# Patient Record
Sex: Male | Born: 1950 | Race: White | Hispanic: No | Smoking: Former smoker
Health system: Southern US, Community
[De-identification: ages and names within clinical notes are randomized; demographics above are authoritative.]

## PROBLEM LIST (undated history)

## (undated) DIAGNOSIS — J439 Emphysema, unspecified: Secondary | ICD-10-CM

## (undated) HISTORY — DX: Emphysema, unspecified: J43.9

---

## 1983-06-28 HISTORY — PX: SKIN GRAFT: SHX250

## 1998-07-27 ENCOUNTER — Emergency Department (HOSPITAL_COMMUNITY): Admission: EM | Admit: 1998-07-27 | Discharge: 1998-07-27 | Payer: Self-pay | Admitting: Emergency Medicine

## 2014-12-10 ENCOUNTER — Other Ambulatory Visit: Payer: Self-pay | Admitting: Hematology & Oncology

## 2018-12-25 ENCOUNTER — Other Ambulatory Visit: Payer: Self-pay

## 2018-12-26 ENCOUNTER — Other Ambulatory Visit: Payer: Self-pay

## 2018-12-26 ENCOUNTER — Ambulatory Visit (INDEPENDENT_AMBULATORY_CARE_PROVIDER_SITE_OTHER): Payer: Medicare HMO

## 2018-12-26 ENCOUNTER — Encounter: Payer: Self-pay | Admitting: Family Medicine

## 2018-12-26 ENCOUNTER — Ambulatory Visit (INDEPENDENT_AMBULATORY_CARE_PROVIDER_SITE_OTHER): Payer: Medicare HMO | Admitting: Family Medicine

## 2018-12-26 VITALS — BP 124/76 | HR 77 | Temp 97.3°F | Ht 64.0 in | Wt 161.0 lb

## 2018-12-26 DIAGNOSIS — Z13220 Encounter for screening for lipoid disorders: Secondary | ICD-10-CM | POA: Diagnosis not present

## 2018-12-26 DIAGNOSIS — B360 Pityriasis versicolor: Secondary | ICD-10-CM

## 2018-12-26 DIAGNOSIS — Z125 Encounter for screening for malignant neoplasm of prostate: Secondary | ICD-10-CM | POA: Diagnosis not present

## 2018-12-26 DIAGNOSIS — Z122 Encounter for screening for malignant neoplasm of respiratory organs: Secondary | ICD-10-CM

## 2018-12-26 DIAGNOSIS — Z1211 Encounter for screening for malignant neoplasm of colon: Secondary | ICD-10-CM

## 2018-12-26 DIAGNOSIS — J439 Emphysema, unspecified: Secondary | ICD-10-CM | POA: Diagnosis not present

## 2018-12-26 DIAGNOSIS — R0602 Shortness of breath: Secondary | ICD-10-CM

## 2018-12-26 DIAGNOSIS — Z136 Encounter for screening for cardiovascular disorders: Secondary | ICD-10-CM | POA: Diagnosis not present

## 2018-12-26 MED ORDER — FLUCONAZOLE 100 MG PO TABS: ORAL_TABLET | ORAL | 0 refills | Status: DC

## 2018-12-26 MED ORDER — BREO ELLIPTA 100-25 MCG/INH IN AEPB: 1.0000 | INHALATION_SPRAY | Freq: Every day | RESPIRATORY_TRACT | 11 refills | Status: DC

## 2018-12-26 MED ORDER — CLOTRIMAZOLE-BETAMETHASONE 1-0.05 % EX CREA: 1.0000 "application " | TOPICAL_CREAM | Freq: Two times a day (BID) | CUTANEOUS | 1 refills | Status: DC

## 2018-12-26 NOTE — Progress Notes (Signed)
Subjective:    Patient ID: Gordon Navarro, male    DOB: Apr 21, 1951, 68 y.o.   MRN: [id number]   HPI: Gordon Navarro is a 68 y.o. male presenting for new patient evaluation.  He has never had a colonoscopy and would like to have that done.  Additionally he is having some shortness of breath.  He was a smoker for many years having stopped about 2 years ago.  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  Additionally he has some dyspnea on exertion for walking from here to the parking lot which would probably be about 100 yards.  He is concerned that he might have COPD but he is never been diagnosed patient has had for several months a rash on his abdomen.  He says it has been present many times in the past as well.  Currently it itches a lot so he scratches it.  He has been putting hydrogen peroxide and Listerine on it.   Depression screen PHQ 2/9 12/26/2018  Decreased Interest 0  Down, Depressed, Hopeless 0  PHQ - 2 Score 0     Relevant past medical, surgical, family and social history reviewed and updated as indicated.  Interim medical history since our last visit reviewed. Allergies and medications reviewed and updated.  ROS:  Review of Systems  Constitutional: Negative.   HENT: Negative.   Eyes: Negative for visual disturbance.  Respiratory: Positive for shortness of breath and wheezing. Negative for cough.   Cardiovascular: Negative for chest pain and leg swelling.  Gastrointestinal: Negative for abdominal pain, diarrhea, nausea and vomiting.  Genitourinary: Negative for difficulty urinating.  Musculoskeletal: Negative for arthralgias and myalgias.  Skin: Positive for rash.  Neurological: Negative for headaches.  Psychiatric/Behavioral: Negative for sleep disturbance.     Social History   Tobacco Use  Smoking Status Former Smoker  Smokeless Tobacco Never Used       Objective:    Physical Exam Constitutional:    General: He is not in acute distress.    Appearance: He is well-developed.  HENT:     Head: Normocephalic and atraumatic.     Right Ear: External ear normal.     Left Ear: External ear normal.     Nose: Nose normal.  Eyes:     Conjunctiva/sclera: Conjunctivae normal.     Pupils: Pupils are equal, round, and reactive to light.  Neck:     Musculoskeletal: Normal range of motion and neck supple.  Cardiovascular:     Rate and Rhythm: Normal rate and regular rhythm.     Heart sounds: Normal heart sounds. No murmur.  Pulmonary:     Effort: Pulmonary effort is normal. No respiratory distress.     Breath sounds: Normal breath sounds. No wheezing or rales.  Abdominal:     Palpations: Abdomen is soft.     Tenderness: There is no abdominal tenderness.  Musculoskeletal: Normal range of motion.  Skin:    General: Skin is warm and dry.  Neurological:     Mental Status: He is alert and oriented to person, place, and time.     Deep Tendon Reflexes: Reflexes are normal and symmetric.  Psychiatric:        Behavior: Behavior normal.        Thought Content: Thought content normal.        Judgment: Judgment normal.     Chest x-ray is negative for masses.  Perhaps minimal hyperexpansion  of the lungs but otherwise normal.  Pulmonary function testing performed that showed severe obstruction.       Assessment & Plan:   1. SOB (shortness of breath)   2. Screening for malignant neoplasm of prostate   3. Lipid screening   4. Pulmonary emphysema, unspecified emphysema type ([place])   5. Encounter for screening for malignant neoplasm of respiratory organs   6. Screen for colon cancer   7. Tinea versicolor     Meds ordered this encounter  Medications  . fluconazole (DIFLUCAN) 100 MG tablet    Sig: Take two with first dose. Then starting the next day take one daily until all are taken.    Dispense:  15 tablet    Refill:  0  . clotrimazole-betamethasone (LOTRISONE) cream    Sig: Apply 1  application topically 2 (two) times daily. To affected areas until rash clears    Dispense:  45 g    Refill:  1  . fluticasone furoate-vilanterol (BREO ELLIPTA) 100-25 MCG/INH AEPB    Sig: Inhale 1 puff into the lungs daily.    Dispense:  1 each    Refill:  11    Orders Placed This Encounter  Procedures  . DG Chest 2 View    Standing Status:   Future    Number of Occurrences:   1    Standing Expiration Date:   02/25/2020    Order Specific Question:   Reason for Exam (SYMPTOM  OR DIAGNOSIS REQUIRED)    Answer:   SOB    Order Specific Question:   Preferred imaging location?    Answer:   Internal  . CT CHEST LUNG CANCER SCREENING LOW DOSE WO CONTRAST    Standing Status:   Future    Standing Expiration Date:   02/26/2020    Order Specific Question:   Radiology Contrast Protocol - do NOT remove file path    Answer:   \\[email or web address]  . CBC with Differential/Platelet  . CMP14+EGFR  . Lipid panel  . PSA, total and free  . Ambulatory referral to Gastroenterology    Referral Priority:   Routine    Referral Type:   Consultation    Referral Reason:   Specialty Services Required    Number of Visits Requested:   1  . PR EVAL OF BRONCHOSPASM      Keanthony was seen today for rash on right side of abd.  Diagnoses and all orders for this visit:  SOB (shortness of breath) -     DG Chest 2 View; Future -     CBC with Differential/Platelet -     CMP14+EGFR -     PR EVAL OF BRONCHOSPASM  Screening for malignant neoplasm of prostate -     PSA, total and free  Lipid screening -     Lipid panel  Pulmonary emphysema, unspecified emphysema type ([place])  Encounter for screening for malignant neoplasm of respiratory organs -     CT CHEST LUNG CANCER SCREENING LOW DOSE WO CONTRAST; Future  Screen for colon cancer -     Ambulatory referral to Gastroenterology  Tinea versicolor  Other orders -     fluconazole (DIFLUCAN) 100 MG tablet; Take two with first dose.  Then starting the next day take one daily until all are taken. -     clotrimazole-betamethasone (LOTRISONE) cream; Apply 1 application topically 2 (two) times daily. To affected areas until rash clears -     fluticasone furoate-vilanterol (BREO ELLIPTA) 100-25 MCG/INH  AEPB; Inhale 1 puff into the lungs daily.      Follow up plan: Return in about 6 weeks (around 02/06/2019).  Claretta Fraise, MD [place]

## 2018-12-27 LAB — CBC WITH DIFFERENTIAL/PLATELET
Basophils Absolute: 0 10*3/uL (ref 0.0–0.2)
Basos: 1 %
EOS (ABSOLUTE): 0.5 10*3/uL — ABNORMAL HIGH (ref 0.0–0.4)
Eos: 6 %
Hematocrit: 42.9 % (ref 37.5–51.0)
Hemoglobin: 13.6 g/dL (ref 13.0–17.7)
Immature Grans (Abs): 0 10*3/uL (ref 0.0–0.1)
Immature Granulocytes: 0 %
Lymphocytes Absolute: 2.3 10*3/uL (ref 0.7–3.1)
Lymphs: 28 %
MCH: 28.3 pg (ref 26.6–33.0)
MCHC: 31.7 g/dL (ref 31.5–35.7)
MCV: 89 fL (ref 79–97)
Monocytes Absolute: 0.7 10*3/uL (ref 0.1–0.9)
Monocytes: 9 %
Neutrophils Absolute: 4.6 10*3/uL (ref 1.4–7.0)
Neutrophils: 56 %
Platelets: 176 10*3/uL (ref 150–450)
RBC: 4.81 x10E6/uL (ref 4.14–5.80)
RDW: 14 % (ref 11.6–15.4)
WBC: 8.1 10*3/uL (ref 3.4–10.8)

## 2018-12-27 LAB — LIPID PANEL
Chol/HDL Ratio: 3.8 ratio (ref 0.0–5.0)
Cholesterol, Total: 165 mg/dL (ref 100–199)
HDL: 44 mg/dL (ref >39)
LDL Calculated: 103 mg/dL — ABNORMAL HIGH (ref 0–99)
Triglycerides: 89 mg/dL (ref 0–149)
VLDL Cholesterol Cal: 18 mg/dL (ref 5–40)

## 2018-12-27 LAB — CMP14+EGFR
ALT: 28 IU/L (ref 0–44)
AST: 28 IU/L (ref 0–40)
Albumin/Globulin Ratio: 1.5 (ref 1.2–2.2)
Albumin: 4.2 g/dL (ref 3.8–4.8)
Alkaline Phosphatase: 91 IU/L (ref 39–117)
BUN/Creatinine Ratio: 15 (ref 10–24)
BUN: 15 mg/dL (ref 8–27)
Bilirubin Total: 0.2 mg/dL (ref 0.0–1.2)
CO2: 25 mmol/L (ref 20–29)
Calcium: 8.9 mg/dL (ref 8.6–10.2)
Chloride: 106 mmol/L (ref 96–106)
Creatinine, Ser: 1 mg/dL (ref 0.76–1.27)
GFR calc Af Amer: 91 mL/min/{1.73_m2} (ref >59)
GFR calc non Af Amer: 79 mL/min/{1.73_m2} (ref >59)
Globulin, Total: 2.8 g/dL (ref 1.5–4.5)
Glucose: 80 mg/dL (ref 65–99)
Potassium: 4.4 mmol/L (ref 3.5–5.2)
Sodium: 145 mmol/L — ABNORMAL HIGH (ref 134–144)
Total Protein: 7 g/dL (ref 6.0–8.5)

## 2018-12-27 LAB — PSA, TOTAL AND FREE
PSA, Free Pct: 50.9 %
PSA, Free: 0.56 ng/mL
Prostate Specific Ag, Serum: 1.1 ng/mL (ref 0.0–4.0)

## 2018-12-28 NOTE — Progress Notes (Signed)
Hello Delson,  Your lab result is normal.Some minor variations that are not significant are commonly marked abnormal, but do not represent any medical problem for you.  Best regards, Claretta Fraise, M.D.

## 2018-12-31 ENCOUNTER — Encounter: Payer: Self-pay | Admitting: Gastroenterology

## 2019-01-21 ENCOUNTER — Other Ambulatory Visit: Payer: Self-pay

## 2019-01-21 ENCOUNTER — Ambulatory Visit (HOSPITAL_COMMUNITY)
Admission: RE | Admit: 2019-01-21 | Discharge: 2019-01-21 | Disposition: A | Discharge status: Home / Self Care | Payer: Medicare HMO | Source: Ambulatory Visit | Attending: Family Medicine | Admitting: Family Medicine

## 2019-01-21 DIAGNOSIS — Z122 Encounter for screening for malignant neoplasm of respiratory organs: Secondary | ICD-10-CM | POA: Diagnosis not present

## 2019-01-21 DIAGNOSIS — F17210 Nicotine dependence, cigarettes, uncomplicated: Secondary | ICD-10-CM | POA: Diagnosis not present

## 2019-01-21 DIAGNOSIS — J439 Emphysema, unspecified: Secondary | ICD-10-CM | POA: Insufficient documentation

## 2019-01-21 DIAGNOSIS — I2510 Atherosclerotic heart disease of native coronary artery without angina pectoris: Secondary | ICD-10-CM | POA: Insufficient documentation

## 2019-01-21 DIAGNOSIS — I700 Atherosclerosis of aorta: Secondary | ICD-10-CM | POA: Insufficient documentation

## 2019-01-21 DIAGNOSIS — Z87891 Personal history of nicotine dependence: Secondary | ICD-10-CM | POA: Diagnosis not present

## 2019-02-06 ENCOUNTER — Ambulatory Visit: Payer: Medicare HMO | Admitting: Family Medicine

## 2019-03-05 ENCOUNTER — Ambulatory Visit: Payer: Medicare HMO | Admitting: Family Medicine

## 2019-03-06 ENCOUNTER — Ambulatory Visit (INDEPENDENT_AMBULATORY_CARE_PROVIDER_SITE_OTHER): Payer: Medicare HMO | Admitting: Family Medicine

## 2019-03-06 ENCOUNTER — Encounter: Payer: Self-pay | Admitting: Family Medicine

## 2019-03-06 DIAGNOSIS — J439 Emphysema, unspecified: Secondary | ICD-10-CM | POA: Insufficient documentation

## 2019-03-06 NOTE — Progress Notes (Signed)
    Subjective:    Patient ID: Gurney Balthazor, male    DOB: 12/10/1950, 68 y.o.   MRN: [id number]   HPI: Aysen Shieh is a 68 y.o. male presenting for recheck of COPD and dermatitis. Using breo daily. He forgets sometimes and he can tell within four hours due to increasing dyspnea. Doesn't occur when he uses the medication daily.  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 That doesn't happen anymore.Occasional cough. Noonproductive. CT lung report reviewed. No sign of Ca. Has phone triage in am FOR COLONOSCOPY.   Depression screen PHQ 2/9 12/26/2018  Decreased Interest 0  Down, Depressed, Hopeless 0  PHQ - 2 Score 0     Relevant past medical, surgical, family and social history reviewed and updated as indicated.  Interim medical history since our last visit reviewed. Allergies and medications reviewed and updated.  ROS:  Review of Systems  Constitutional: Negative.  Negative for fever.  HENT: Positive for rhinorrhea (occasional, clear, when he starts walking.).   Eyes: Negative for visual disturbance.  Respiratory: Negative for cough and shortness of breath.   Cardiovascular: Negative for chest pain and leg swelling.  Gastrointestinal: Negative for abdominal pain, diarrhea, nausea and vomiting.  Genitourinary: Negative for difficulty urinating.  Musculoskeletal: Negative for arthralgias and myalgias.  Skin: Negative for rash.  Neurological: Negative for headaches.  Psychiatric/Behavioral: Negative for sleep disturbance.     Social History   Tobacco Use  Smoking Status Former Smoker  Smokeless Tobacco Never Used       Objective:     Wt Readings from Last 3 Encounters:  12/26/18 161 lb (73 kg)     Exam deferred. Pt. Harbor[place] due to COVID 19. Phone visit performed.   Assessment & Plan:   1. Pulmonary emphysema, unspecified emphysema type ([place])     Continue inhaler - Breo.  Confirmed upcoming Colonoscopy. Reviewed Low dose CT chest - no sign of lung  Ca.    Diagnoses and all orders for this visit:  Pulmonary emphysema, unspecified emphysema type ([place])    Virtual Visit via telephone Note  I discussed the limitations, risks, security and privacy concerns of performing an evaluation and management service by telephone and the availability of in person appointments. The patient was identified with two identifiers. Pt.expressed understanding and agreed to proceed. Pt. Is at home. Dr. Livia Snellen is in his office.  Follow Up Instructions:   I discussed the assessment and treatment plan with the patient. The patient was provided an opportunity to ask questions and all were answered. The patient agreed with the plan and demonstrated an understanding of the instructions.   The patient was advised to call back or seek an in-person evaluation if the symptoms worsen or if the condition fails to improve as anticipated.   Total minutes including chart review and phone contact time: 22   Follow up plan: Return in about 6 months (around 09/03/2019) for CPE.  Claretta Fraise, MD [place]

## 2019-03-07 ENCOUNTER — Ambulatory Visit (INDEPENDENT_AMBULATORY_CARE_PROVIDER_SITE_OTHER): Payer: Self-pay | Admitting: *Deleted

## 2019-03-07 ENCOUNTER — Other Ambulatory Visit: Payer: Self-pay

## 2019-03-07 DIAGNOSIS — Z1211 Encounter for screening for malignant neoplasm of colon: Secondary | ICD-10-CM

## 2019-03-07 MED ORDER — PEG 3350-KCL-NA BICARB-NACL 420 G PO SOLR: 4000.0000 mL | Freq: Once | ORAL | 0 refills | Status: AC

## 2019-03-07 NOTE — Progress Notes (Signed)
Gastroenterology Pre-Procedure Review  Request Date: 03/07/2019 Requesting Physician: Dr. Livia Snellen @ [facility], no previous TCS  PATIENT REVIEW QUESTIONS: The patient responded to the following health history questions as indicated:    1. Diabetes Melitis: No 2. Joint replacements in the past 12 months: No 3. Major health problems in the past 3 months: No 4. Has an artificial valve or MVP: No 5. Has a defibrillator: No 6. Has been advised in past to take antibiotics in advance of a procedure like teeth cleaning: No 7. Family history of colon cancer: No  8. Alcohol Use: Yes, 1 ounce of liquor a day 9. History of sleep apnea: No   10. History of coronary artery or other vascular stents placed within the last 12 months: No 11. History of any prior anesthesia complications: No    MEDICATIONS & ALLERGIES:    Patient reports the following regarding taking any blood thinners:   Plavix? No Aspirin? No Coumadin? No Brilinta? No Xarelto? No Eliquis? No Pradaxa? No Savaysa? No Effient?  No  Patient confirms/reports the following medications:  Current Outpatient Medications  Medication Sig Dispense Refill  . fluticasone furoate-vilanterol (BREO ELLIPTA) 100-25 MCG/INH AEPB Inhale 1 puff into the lungs daily. 1 each 11   No current facility-administered medications for this visit.     Patient confirms/reports the following allergies:  No Known Allergies  No orders of the defined types were placed in this encounter.   [address] INFORMATION Primary Insurance: [facility],  ID#: [id number],  Group #: [id number] Pre-Cert / Auth required: No, not required  Secondary Insurance: [employer],  ID #: [id number] Pre-Cert / Josem Kaufmann required: No, not required  SCHEDULE INFORMATION: Procedure has been scheduled as follows:  Date: 05/15/2019, Time: 8:30 Location: [facility] with Dr. Gala Romney  This Gastroenterology Pre-Precedure Review Form is being routed to the following provider(s): Roseanne Kaufman, NP

## 2019-03-07 NOTE — Patient Instructions (Signed)
Gordon Navarro   1950-07-24 MRN: [id number]    Procedure Date: 05/15/2019 Time to register: 7:30 am Place to register: [facility] Procedure Time: 8:30 am Scheduled provider: Dr. Gala Romney  PREPARATION FOR COLONOSCOPY WITH TRI-LYTE SPLIT PREP  Please notify us immediately if you are diabetic, take iron supplements, or if you are on Coumadin or any other blood thinners.    You will need to purchase 1 fleet enema and 1 box of Bisacodyl 22m tablets.   2 DAYS BEFORE PROCEDURE:  DATE: 05/13/2019   DAY: Monday Begin clear liquid diet AFTER your lunch meal. NO SOLID FOODS after this point.  1 DAY BEFORE PROCEDURE:  DATE: 05/14/2019  DAY: Tuesday Continue clear liquids the entire day - NO SOLID FOOD.   At 2:00 pm:  Take 2 Bisacodyl tablets.   At 4:00pm:  Start drinking your solution. Make sure you mix well per instructions on the bottle. Try to drink 1 (one) 8 ounce glass every 10-15 minutes until you have consumed HALF the jug. You should complete by 6:00pm.You must keep the left over solution refrigerated until completed next day.  Continue clear liquids. You must drink plenty of clear liquids to prevent dehyration and kidney failure.     DAY OF PROCEDURE:   DATE: 05/15/2019  DAY:  Wednesday If you take medications for your heart, blood pressure or breathing, you may take these medications.    Five hours before your procedure time @  3:30 am:  Finish remaining amout of bowel prep, drinking 1 (one) 8 ounce glass every 10-15 minutes until complete. You have two hours to consume remaining prep.   Three hours before your procedure time @ 5:30 am:  Nothing by mouth.   At least one hour before going to the hospital:  Give yourself one Fleet enema. You may take your morning medications with sip of water unless we have instructed otherwise.      Please see below for Dietary Information.  CLEAR LIQUIDS INCLUDE:  Water Jello (NOT red in color)   Ice Popsicles (NOT red in color)    Tea (sugar ok, no milk/cream) Powdered fruit flavored drinks  Coffee (sugar ok, no milk/cream) Gatorade/ Lemonade/ Kool-Aid  (NOT red in color)   Juice: apple, white grape, white cranberry Soft drinks  Clear bullion, consomme, broth (fat free beef/chicken/vegetable)  Carbonated beverages (any kind)  Strained chicken noodle soup Hard Candy   Remember: Clear liquids are liquids that will allow you to see your fingers on the other side of a clear glass. Be sure liquids are NOT red in color, and not cloudy, but CLEAR.  DO NOT EAT OR DRINK ANY OF THE FOLLOWING:  Dairy products of any kind   Cranberry juice Tomato juice / V8 juice   Grapefruit juice Orange juice     Red grape juice  Do not eat any solid foods, including such foods as: cereal, oatmeal, yogurt, fruits, vegetables, creamed soups, eggs, bread, crackers, pureed foods in a blender, etc.   HELPFUL HINTS FOR DRINKING PREP SOLUTION:   Make sure prep is extremely cold. Mix and refrigerate the the morning of the prep. You may also put in the freezer.   You may try mixing some Crystal Light or Country Time Lemonade if you prefer. Mix in small amounts; add more if necessary.  Try drinking through a straw  Rinse mouth with water or a mouthwash between glasses, to remove after-taste.  Try sipping on a cold beverage /ice/ popsicles between glasses  of prep.  Place a piece of sugar-free hard candy in mouth between glasses.  If you become nauseated, try consuming smaller amounts, or stretch out the time between glasses. Stop for 30-60 minutes, then slowly start back drinking.        OTHER INSTRUCTIONS  You will need a responsible adult at least 66 years of age to accompany you and drive you home. This person must remain in the waiting room during your procedure. The hospital will cancel your procedure if you do not have a responsible adult with you.   1. Wear loose fitting clothing that is easily removed. 2. Leave jewelry and  other valuables at home.  3. Remove all body piercing jewelry and leave at home. 4. Total time from sign-in until discharge is approximately 2-3 hours. 5. You should go home directly after your procedure and rest. You can resume normal activities the day after your procedure. 6. The day of your procedure you should not:  Drive  Make legal decisions  Operate machinery  Drink alcohol  Return to work   You may call the office (Dept: [phone]) before 5:00pm, or page the doctor on call [phone]) after 5:00pm, for further instructions, if necessary.   Insurance Information YOU WILL NEED TO CHECK WITH YOUR INSURANCE COMPANY FOR THE BENEFITS OF COVERAGE YOU HAVE FOR THIS PROCEDURE.  UNFORTUNATELY, NOT ALL INSURANCE COMPANIES HAVE BENEFITS TO COVER ALL OR PART OF THESE TYPES OF PROCEDURES.  IT IS YOUR RESPONSIBILITY TO CHECK YOUR BENEFITS, HOWEVER, WE WILL BE GLAD TO ASSIST YOU WITH ANY CODES YOUR INSURANCE COMPANY MAY NEED.    PLEASE NOTE THAT MOST INSURANCE COMPANIES WILL NOT COVER A SCREENING COLONOSCOPY FOR PEOPLE UNDER THE AGE OF 50  IF YOU HAVE BCBS INSURANCE, YOU MAY HAVE BENEFITS FOR A SCREENING COLONOSCOPY BUT IF POLYPS ARE FOUND THE DIAGNOSIS WILL CHANGE AND THEN YOU MAY HAVE A DEDUCTIBLE THAT WILL NEED TO BE MET. SO PLEASE MAKE SURE YOU CHECK YOUR BENEFITS FOR A SCREENING COLONOSCOPY AS WELL AS A DIAGNOSTIC COLONOSCOPY.

## 2019-03-12 NOTE — Progress Notes (Signed)
Needs Propofol.  

## 2019-03-14 ENCOUNTER — Encounter: Payer: Self-pay | Admitting: *Deleted

## 2019-03-14 NOTE — Progress Notes (Signed)
Mailed letter with appointment information and procedure cancellation.   

## 2019-04-02 NOTE — Progress Notes (Signed)
Primary Care Physician:  Claretta Fraise, MD  Primary Gastroenterologist:  Garfield Cornea, MD   Chief Complaint  Patient presents with  . Colonoscopy    consult, never had tcs    HPI:  Gordon Navarro is a 68 y.o. male here at the request of Dr. Livia Snellen for colonoscopy. Due to daily etoh use, he required office visit to discuss sedation. No prior colonoscopy.   Patient denies constipation, diarrhea, melena, rectal bleeding, heartburn, vomiting, dysphagia.  He has some vague right upper quadrant discomfort after he eats, especially if he overeats.  It only occurs when he eats and bends over.  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    Patient drinks a couple of ounces of liquor off and on for years.  Denies heavy or daily use although initially reported to [place] that he drinks 1 ounce of liquor daily.  Current Outpatient Medications  Medication Sig Dispense Refill  . fluticasone furoate-vilanterol (BREO ELLIPTA) 100-25 MCG/INH AEPB Inhale 1 puff into the lungs daily. 1 each 11   No current facility-administered medications for this visit.     Allergies as of 04/03/2019  . (No Known Allergies)    Past Medical History:  Diagnosis Date  . Emphysema of lung [facility])     Past Surgical History:  Procedure Laterality Date  . SKIN GRAFT  1985   Left hand    Family History  Problem Relation Age of Onset  . Heart attack Father   . Colon cancer Neg Hx     Social History   Socioeconomic History  . Marital status: Legally Separated    Spouse name: Not on file  . Number of children: Not on file  . Years of education: Not on file  . Highest education level: Not on file  Occupational History  . Not on file  Social Needs  . Financial resource strain: Not on file  . Food insecurity    Worry: Not on file    Inability: Not on file  . Transportation needs    Medical: Not on file    Non-medical: Not on file  Tobacco Use  . Smoking status:  Former Research scientist (life sciences)  . Smokeless tobacco: Never Used  Substance and Sexual Activity  . Alcohol use: Yes    Frequency: Never    Comment: 1 ounce of liquor some days  . Drug use: Never  . Sexual activity: Not Currently  Lifestyle  . Physical activity    Days per week: Not on file    Minutes per session: Not on file  . Stress: Not on file  Relationships  . Social Herbalist on phone: Not on file    Gets together: Not on file    Attends religious service: Not on file    Active member of club or organization: Not on file    Attends meetings of clubs or organizations: Not on file    Relationship status: Not on file  . Intimate partner violence    Fear of current or ex partner: Not on file    Emotionally abused: Not on file    Physically abused: Not on file    Forced sexual activity: Not on file  Other Topics Concern  . Not on file  Social History Narrative  . Not on file      ROS:  General: Negative for anorexia, weight loss, fever, chills, fatigue, weakness. Eyes: Negative for vision changes.  ENT:  Negative for hoarseness, difficulty swallowing , nasal congestion. CV: Negative for chest pain, angina, palpitations, positive dyspnea on exertion, peripheral edema.  Respiratory: Negative for dyspnea at rest, positive dyspnea on exertion, cough, sputum, wheezing.  GI: See history of present illness. GU:  Negative for dysuria, hematuria, urinary incontinence, urinary frequency, nocturnal urination.  MS: Negative for joint pain, low back pain.  Derm: Negative for rash or itching.  Neuro: Negative for weakness, abnormal sensation, seizure, frequent headaches, memory loss, confusion.  Psych: Negative for anxiety, depression, suicidal ideation, hallucinations.  Endo: Negative for unusual weight change.  Heme: Negative for bruising or bleeding. Allergy: Negative for rash or hives.    Physical Examination:  BP 123/78   Pulse 94   Temp (!) 97.1 F (36.2 C) (Temporal)   Ht  5\' 5"  (1.651 m)   Wt 160 lb 12.8 oz (72.9 kg)   BMI 26.76 kg/m    General: Well-nourished, well-developed in no acute distress.  Head: Normocephalic, atraumatic.   Eyes: Conjunctiva pink, no icterus. Mouth: Oropharyngeal mucosa moist and pink , no lesions erythema or exudate. Neck: Supple without thyromegaly, masses, or lymphadenopathy.  Lungs: Clear to auscultation bilaterally.  Heart: Regular rate and rhythm, no murmurs rubs or gallops.  Abdomen: Bowel sounds are normal, nontender, nondistended, no hepatosplenomegaly or masses, no abdominal bruits or    hernia , no rebound or guarding.   Rectal: deferred Extremities: No lower extremity edema. No clubbing or deformities.  Neuro: Alert and oriented x 4 , grossly normal neurologically.  Skin: Warm and dry, no rash or jaundice.   Psych: Alert and cooperative, normal mood and affect.  Labs: Lab Results  Component Value Date   CREATININE 1.00 12/26/2018   BUN 15 12/26/2018   NA 145 (H) 12/26/2018   K 4.4 12/26/2018   CL 106 12/26/2018   CO2 25 12/26/2018   Lab Results  Component Value Date   ALT 28 12/26/2018   AST 28 12/26/2018   ALKPHOS 91 12/26/2018   BILITOT 0.2 12/26/2018   Lab Results  Component Value Date   WBC 8.1 12/26/2018   HGB 13.6 12/26/2018   HCT 42.9 12/26/2018   MCV 89 12/26/2018   PLT 176 12/26/2018     Imaging Studies: No results found.

## 2019-04-03 ENCOUNTER — Ambulatory Visit (INDEPENDENT_AMBULATORY_CARE_PROVIDER_SITE_OTHER): Payer: Medicare HMO | Admitting: Gastroenterology

## 2019-04-03 ENCOUNTER — Other Ambulatory Visit: Payer: Self-pay

## 2019-04-03 ENCOUNTER — Encounter: Payer: Self-pay | Admitting: Gastroenterology

## 2019-04-03 ENCOUNTER — Other Ambulatory Visit: Payer: Self-pay | Admitting: *Deleted

## 2019-04-03 ENCOUNTER — Encounter: Payer: Self-pay | Admitting: *Deleted

## 2019-04-03 DIAGNOSIS — Z1211 Encounter for screening for malignant neoplasm of colon: Secondary | ICD-10-CM

## 2019-04-03 DIAGNOSIS — J438 Other emphysema: Secondary | ICD-10-CM

## 2019-04-03 DIAGNOSIS — R1011 Right upper quadrant pain: Secondary | ICD-10-CM | POA: Diagnosis not present

## 2019-04-03 DIAGNOSIS — J439 Emphysema, unspecified: Secondary | ICD-10-CM | POA: Insufficient documentation

## 2019-04-03 NOTE — Progress Notes (Signed)
cc'ed to pcp °

## 2019-04-03 NOTE — Patient Instructions (Signed)
1. Colonoscopy as scheduled. See separate instructions.  

## 2019-04-03 NOTE — Assessment & Plan Note (Signed)
68 year old gentleman presenting to schedule first ever screening colonoscopy.  Denies any bowel concerns.  No family history of colon cancer.  Alcohol use as described above, patient denies daily or heavy use although he reported daily use previously to [place].  He also has a history of emphysema.  Plan for deep sedation.  I have discussed the risks, alternatives, benefits with regards to but not limited to the risk of reaction to medication, bleeding, infection, perforation and the patient is agreeable to proceed. Written consent to be obtained.  Complains of 40-year history of vague right upper quadrant discomfort only occurring with bending over after eating.  Especially occurring with overeating.  Given chronicity of symptoms, unlikely significant findings.  Await colonoscopy.  Further recommendations to follow.

## 2019-05-13 ENCOUNTER — Other Ambulatory Visit (HOSPITAL_COMMUNITY): Payer: Medicare HMO

## 2019-06-07 ENCOUNTER — Telehealth: Payer: Self-pay | Admitting: *Deleted

## 2019-06-07 ENCOUNTER — Other Ambulatory Visit (HOSPITAL_COMMUNITY)
Admission: RE | Admit: 2019-06-07 | Discharge: 2019-06-07 | Disposition: A | Discharge status: Home / Self Care | Payer: Medicare HMO | Source: Ambulatory Visit | Attending: Internal Medicine | Admitting: Internal Medicine

## 2019-06-07 ENCOUNTER — Other Ambulatory Visit: Payer: Self-pay

## 2019-06-07 ENCOUNTER — Encounter (HOSPITAL_COMMUNITY)
Admission: RE | Admit: 2019-06-07 | Discharge: 2019-06-07 | Disposition: A | Discharge status: Home / Self Care | Payer: Medicare HMO | Source: Ambulatory Visit | Attending: Internal Medicine | Admitting: Internal Medicine

## 2019-06-07 NOTE — Telephone Encounter (Signed)
-----   Message from Wilmer Floor, RN sent at 06/07/2019  9:15 AM EST ----- Regarding: Patient has a cough, and headache and wants to rescedule Happy Friday,  I called Mr Cutler to tell him his time for procedure for 06/10/2019. He has picked up prep.   He said he wanted to reschedule due to not feeling well.  Headache and cough  Thanks,  Rosalyn Gess

## 2019-06-07 NOTE — Telephone Encounter (Signed)
Called patient. Confirmed he wanted to r/s. He has rescheduled to 3/11 at 10:00am. Patient aware will mail new instructions with new pre-op/covid test. Called endo and made aware of appt change.

## 2019-08-27 ENCOUNTER — Other Ambulatory Visit: Payer: Self-pay

## 2019-08-28 ENCOUNTER — Encounter: Payer: Self-pay | Admitting: Family Medicine

## 2019-08-28 ENCOUNTER — Other Ambulatory Visit: Payer: Self-pay

## 2019-08-28 ENCOUNTER — Ambulatory Visit (INDEPENDENT_AMBULATORY_CARE_PROVIDER_SITE_OTHER): Payer: Medicare HMO | Admitting: Family Medicine

## 2019-08-28 VITALS — BP 126/79 | HR 100 | Temp 98.9°F | Ht 65.0 in | Wt 159.4 lb

## 2019-08-28 DIAGNOSIS — E559 Vitamin D deficiency, unspecified: Secondary | ICD-10-CM | POA: Diagnosis not present

## 2019-08-28 DIAGNOSIS — Z136 Encounter for screening for cardiovascular disorders: Secondary | ICD-10-CM | POA: Diagnosis not present

## 2019-08-28 DIAGNOSIS — Z1159 Encounter for screening for other viral diseases: Secondary | ICD-10-CM | POA: Diagnosis not present

## 2019-08-28 DIAGNOSIS — Z23 Encounter for immunization: Secondary | ICD-10-CM

## 2019-08-28 DIAGNOSIS — Z0000 Encounter for general adult medical examination without abnormal findings: Secondary | ICD-10-CM

## 2019-08-28 DIAGNOSIS — Z0001 Encounter for general adult medical examination with abnormal findings: Secondary | ICD-10-CM

## 2019-08-28 DIAGNOSIS — Z13220 Encounter for screening for lipoid disorders: Secondary | ICD-10-CM

## 2019-08-28 DIAGNOSIS — Z125 Encounter for screening for malignant neoplasm of prostate: Secondary | ICD-10-CM | POA: Diagnosis not present

## 2019-08-28 DIAGNOSIS — J439 Emphysema, unspecified: Secondary | ICD-10-CM

## 2019-08-28 LAB — URINALYSIS
Bilirubin, UA: NEGATIVE
Glucose, UA: NEGATIVE
Ketones, UA: NEGATIVE
Leukocytes,UA: NEGATIVE
Nitrite, UA: NEGATIVE
Protein,UA: NEGATIVE
RBC, UA: NEGATIVE
Specific Gravity, UA: >1.03 — ABNORMAL HIGH (ref 1.005–1.030)
Urobilinogen, Ur: 0.2 mg/dL (ref 0.2–1.0)
pH, UA: 5 (ref 5.0–7.5)

## 2019-08-28 MED ORDER — BREO ELLIPTA 100-25 MCG/INH IN AEPB: 1.0000 | INHALATION_SPRAY | Freq: Every day | RESPIRATORY_TRACT | 11 refills | Status: DC

## 2019-08-28 NOTE — Progress Notes (Signed)
Subjective:  Patient ID: Gordon Navarro, male    DOB: 14-Jul-1950  Age: 69 y.o. MRN: [id number]  CC: Annual Exam and Cyst (Right Hand-6 months)   HPI Gordon Navarro presents for CPE. He is taking Breo daily with good result for controlling COPD. He denies dyspnea.  Depression screen [facility] 2/9 08/28/2019 12/26/2018  Decreased Interest 0 0  Down, Depressed, Hopeless 0 0  PHQ - 2 Score 0 0    History Gordon Navarro has a past medical history of Emphysema of lung (Gordon Navarro).   He has a past surgical history that includes Skin graft (1985).   His family history includes Heart attack in his father.He reports that he has quit smoking. He has never used smokeless tobacco. He reports current alcohol use. He reports that he does not use drugs.    ROS Review of Systems  Constitutional: Negative for activity change, fatigue and unexpected weight change.  HENT: Negative for congestion, ear pain, hearing loss, postnasal drip and trouble swallowing.   Eyes: Negative for pain and visual disturbance.  Respiratory: Negative for cough, chest tightness and shortness of breath.   Cardiovascular: Negative for chest pain, palpitations and leg swelling.  Gastrointestinal: Negative for abdominal distention, abdominal pain, blood in stool, constipation, diarrhea, nausea and vomiting.  Endocrine: Negative for cold intolerance, heat intolerance and polydipsia.  Genitourinary: Negative for difficulty urinating, dysuria, flank pain, frequency and urgency.  Musculoskeletal: Negative for arthralgias and joint swelling.  Skin: Negative for color change, rash and wound.  Neurological: Negative for dizziness, syncope, speech difficulty, weakness, light-headedness, numbness and headaches.  Hematological: Does not bruise/bleed easily.  Psychiatric/Behavioral: Negative for confusion, decreased concentration, dysphoric mood and sleep disturbance. The patient is not nervous/anxious.     Objective:  BP 126/79   Pulse 100   Temp 98.9  F (37.2 C) (Temporal)   Ht 5' 5"  (1.651 m)   Wt 159 lb 6.4 oz (72.3 kg)   BMI 26.53 kg/m   BP Readings from Last 3 Encounters:  08/28/19 126/79  04/03/19 123/78  12/26/18 124/76    Wt Readings from Last 3 Encounters:  08/28/19 159 lb 6.4 oz (72.3 kg)  04/03/19 160 lb 12.8 oz (72.9 kg)  12/26/18 161 lb (73 kg)     Physical Exam Constitutional:      Appearance: He is well-developed.  HENT:     Head: Normocephalic and atraumatic.  Eyes:     Pupils: Pupils are equal, round, and reactive to light.  Neck:     Thyroid: No thyromegaly.     Trachea: No tracheal deviation.  Cardiovascular:     Rate and Rhythm: Normal rate and regular rhythm.     Heart sounds: Normal heart sounds. No murmur. No friction rub. No gallop.   Pulmonary:     Breath sounds: Normal breath sounds. No wheezing or rales.  Abdominal:     General: Bowel sounds are normal. There is no distension.     Palpations: Abdomen is soft. There is no mass.     Tenderness: There is no abdominal tenderness.     Hernia: There is no hernia in the left inguinal area.  Genitourinary:    Penis: Normal.      Testes: Normal.  Musculoskeletal:        General: Normal range of motion.     Cervical back: Normal range of motion.  Lymphadenopathy:     Cervical: No cervical adenopathy.  Skin:    General: Skin is warm and dry.  Neurological:  Mental Status: He is alert and oriented to person, place, and time.       Assessment & Plan:   Gordon Navarro was seen today for annual exam and cyst.  Diagnoses and all orders for this visit:  Pulmonary emphysema, unspecified emphysema type ([place]) -     CBC with Differential/Platelet -     CMP14+EGFR -     Urinalysis  Well adult exam -     CBC with Differential/Platelet -     CMP14+EGFR -     Lipid panel -     Urinalysis  Vitamin D deficiency -     CBC with Differential/Platelet -     CMP14+EGFR -     VITAMIN D 25 Hydroxy (Vit-D Deficiency, Fractures)  Need for hepatitis C  screening test -     CBC with Differential/Platelet -     CMP14+EGFR  Screening for prostate cancer -     CBC with Differential/Platelet -     CMP14+EGFR -     PSA Total (Reflex To Free)  Screening cholesterol level -     Lipid panel  Other orders -     fluticasone furoate-vilanterol (BREO ELLIPTA) 100-25 MCG/INH AEPB; Inhale 1 puff into the lungs daily. -     Pneumococcal conjugate vaccine 13-valent       I have discontinued Gordon Navarro's polyethylene glycol-electrolytes. I am also having him maintain his ibuprofen and Breo Ellipta.  Allergies as of 08/28/2019   No Known Allergies     Medication List       Accurate as of August 28, 2019 10:31 PM. If you have any questions, ask your nurse or doctor.        STOP taking these medications   polyethylene glycol-electrolytes 420 g solution Commonly known as: NuLYTELY Stopped by: Claretta Fraise, MD     TAKE these medications   Breo Ellipta 100-25 MCG/INH Aepb Generic drug: fluticasone furoate-vilanterol Inhale 1 puff into the lungs daily.   ibuprofen 200 MG tablet Commonly known as: ADVIL Take 200-400 mg by mouth every 8 (eight) hours as needed (headache).        Follow-up: Return in about 1 year (around 08/27/2020).  Claretta Fraise, M.D.

## 2019-08-29 ENCOUNTER — Other Ambulatory Visit: Payer: Self-pay

## 2019-08-29 LAB — CMP14+EGFR
ALT: 38 IU/L (ref 0–44)
AST: 30 IU/L (ref 0–40)
Albumin/Globulin Ratio: 1.3 (ref 1.2–2.2)
Albumin: 4.5 g/dL (ref 3.8–4.8)
Alkaline Phosphatase: 123 IU/L — ABNORMAL HIGH (ref 39–117)
BUN/Creatinine Ratio: 17 (ref 10–24)
BUN: 24 mg/dL (ref 8–27)
Bilirubin Total: 0.3 mg/dL (ref 0.0–1.2)
CO2: 21 mmol/L (ref 20–29)
Calcium: 8.8 mg/dL (ref 8.6–10.2)
Chloride: 100 mmol/L (ref 96–106)
Creatinine, Ser: 1.41 mg/dL — ABNORMAL HIGH (ref 0.76–1.27)
GFR calc Af Amer: 60 mL/min/{1.73_m2} (ref >59)
GFR calc non Af Amer: 52 mL/min/{1.73_m2} — ABNORMAL LOW (ref >59)
Globulin, Total: 3.5 g/dL (ref 1.5–4.5)
Glucose: 92 mg/dL (ref 65–99)
Potassium: 4.5 mmol/L (ref 3.5–5.2)
Sodium: 138 mmol/L (ref 134–144)
Total Protein: 8 g/dL (ref 6.0–8.5)

## 2019-08-29 LAB — CBC WITH DIFFERENTIAL/PLATELET
Basophils Absolute: 0 10*3/uL (ref 0.0–0.2)
Basos: 0 %
EOS (ABSOLUTE): 0.2 10*3/uL (ref 0.0–0.4)
Eos: 2 %
Hematocrit: 47.6 % (ref 37.5–51.0)
Hemoglobin: 15.6 g/dL (ref 13.0–17.7)
Immature Grans (Abs): 0 10*3/uL (ref 0.0–0.1)
Immature Granulocytes: 0 %
Lymphocytes Absolute: 2.2 10*3/uL (ref 0.7–3.1)
Lymphs: 18 %
MCH: 28.8 pg (ref 26.6–33.0)
MCHC: 32.8 g/dL (ref 31.5–35.7)
MCV: 88 fL (ref 79–97)
Monocytes Absolute: 0.9 10*3/uL (ref 0.1–0.9)
Monocytes: 8 %
Neutrophils Absolute: 8.3 10*3/uL — ABNORMAL HIGH (ref 1.4–7.0)
Neutrophils: 72 %
Platelets: 193 10*3/uL (ref 150–450)
RBC: 5.41 x10E6/uL (ref 4.14–5.80)
RDW: 13.7 % (ref 11.6–15.4)
WBC: 11.7 10*3/uL — ABNORMAL HIGH (ref 3.4–10.8)

## 2019-08-29 LAB — PSA TOTAL (REFLEX TO FREE): Prostate Specific Ag, Serum: 1.5 ng/mL (ref 0.0–4.0)

## 2019-08-29 LAB — LIPID PANEL
Chol/HDL Ratio: 3.5 ratio (ref 0.0–5.0)
Cholesterol, Total: 187 mg/dL (ref 100–199)
HDL: 53 mg/dL (ref >39)
LDL Chol Calc (NIH): 121 mg/dL — ABNORMAL HIGH (ref 0–99)
Triglycerides: 70 mg/dL (ref 0–149)
VLDL Cholesterol Cal: 13 mg/dL (ref 5–40)

## 2019-08-29 LAB — VITAMIN D 25 HYDROXY (VIT D DEFICIENCY, FRACTURES): Vit D, 25-Hydroxy: 22.4 ng/mL — ABNORMAL LOW (ref 30.0–100.0)

## 2019-08-29 MED ORDER — VITAMIN D (ERGOCALCIFEROL) 1.25 MG (50000 UNIT) PO CAPS: 50000.0000 [IU] | ORAL_CAPSULE | ORAL | 1 refills | Status: DC

## 2019-09-03 ENCOUNTER — Other Ambulatory Visit: Payer: Self-pay

## 2019-09-03 ENCOUNTER — Telehealth: Payer: Self-pay

## 2019-09-03 ENCOUNTER — Encounter (HOSPITAL_COMMUNITY)
Admission: RE | Admit: 2019-09-03 | Discharge: 2019-09-03 | Disposition: A | Discharge status: Home / Self Care | Payer: Medicare HMO | Source: Ambulatory Visit | Attending: Internal Medicine | Admitting: Internal Medicine

## 2019-09-03 ENCOUNTER — Other Ambulatory Visit (HOSPITAL_COMMUNITY)
Admission: RE | Admit: 2019-09-03 | Discharge: 2019-09-03 | Disposition: A | Discharge status: Home / Self Care | Payer: Medicare HMO | Source: Ambulatory Visit | Attending: Internal Medicine | Admitting: Internal Medicine

## 2019-09-03 NOTE — Telephone Encounter (Signed)
Received message from pre-op nurse, unable to contact pt for pre-op phone call.  Tried to call pt, no answer, LMOVM.

## 2019-09-04 ENCOUNTER — Encounter: Payer: Medicare HMO | Admitting: Family Medicine

## 2019-09-04 NOTE — Telephone Encounter (Signed)
Spoke with carolyn in endo. If patient called back he will have to r/s. Orders placed in depot

## 2019-09-04 NOTE — Telephone Encounter (Signed)
Tried to call pt, no answer, LMOVM for return call.  

## 2019-09-05 ENCOUNTER — Ambulatory Visit (HOSPITAL_COMMUNITY): Admission: RE | Admit: 2019-09-05 | Payer: Medicare HMO | Source: Home / Self Care | Admitting: Internal Medicine

## 2019-09-05 ENCOUNTER — Encounter (HOSPITAL_COMMUNITY): Admission: RE | Payer: Self-pay | Source: Home / Self Care

## 2019-09-05 SURGERY — COLONOSCOPY WITH PROPOFOL
Anesthesia: Monitor Anesthesia Care

## 2019-09-05 NOTE — Telephone Encounter (Signed)
Patient never called back. Procedure cancelled. FYI to LSL

## 2019-09-05 NOTE — Telephone Encounter (Signed)
noted 

## 2019-10-15 ENCOUNTER — Encounter (HOSPITAL_COMMUNITY): Payer: Self-pay | Admitting: Emergency Medicine

## 2019-10-15 ENCOUNTER — Emergency Department (HOSPITAL_COMMUNITY): Payer: Medicare HMO

## 2019-10-15 ENCOUNTER — Other Ambulatory Visit: Payer: Self-pay

## 2019-10-15 ENCOUNTER — Observation Stay (HOSPITAL_COMMUNITY)
Admission: EM | Admit: 2019-10-15 | Discharge: 2019-10-19 | Disposition: A | Discharge status: Home / Self Care | Payer: Medicare HMO | Attending: Internal Medicine | Admitting: Internal Medicine

## 2019-10-15 DIAGNOSIS — J441 Chronic obstructive pulmonary disease with (acute) exacerbation: Secondary | ICD-10-CM

## 2019-10-15 DIAGNOSIS — J439 Emphysema, unspecified: Secondary | ICD-10-CM | POA: Diagnosis not present

## 2019-10-15 DIAGNOSIS — Z87891 Personal history of nicotine dependence: Secondary | ICD-10-CM | POA: Diagnosis not present

## 2019-10-15 DIAGNOSIS — K8012 Calculus of gallbladder with acute and chronic cholecystitis without obstruction: Principal | ICD-10-CM | POA: Insufficient documentation

## 2019-10-15 DIAGNOSIS — Z20822 Contact with and (suspected) exposure to covid-19: Secondary | ICD-10-CM | POA: Insufficient documentation

## 2019-10-15 DIAGNOSIS — R05 Cough: Secondary | ICD-10-CM | POA: Diagnosis not present

## 2019-10-15 DIAGNOSIS — K821 Hydrops of gallbladder: Secondary | ICD-10-CM | POA: Insufficient documentation

## 2019-10-15 DIAGNOSIS — K810 Acute cholecystitis: Secondary | ICD-10-CM | POA: Diagnosis not present

## 2019-10-15 DIAGNOSIS — Z03818 Encounter for observation for suspected exposure to other biological agents ruled out: Secondary | ICD-10-CM | POA: Diagnosis not present

## 2019-10-15 DIAGNOSIS — R1011 Right upper quadrant pain: Secondary | ICD-10-CM

## 2019-10-15 DIAGNOSIS — K828 Other specified diseases of gallbladder: Secondary | ICD-10-CM | POA: Diagnosis not present

## 2019-10-15 DIAGNOSIS — Z7951 Long term (current) use of inhaled steroids: Secondary | ICD-10-CM | POA: Diagnosis not present

## 2019-10-15 DIAGNOSIS — R1084 Generalized abdominal pain: Secondary | ICD-10-CM

## 2019-10-15 DIAGNOSIS — R109 Unspecified abdominal pain: Secondary | ICD-10-CM

## 2019-10-15 LAB — URINALYSIS, ROUTINE W REFLEX MICROSCOPIC
Bacteria, UA: NONE SEEN
Bilirubin Urine: NEGATIVE
Glucose, UA: NEGATIVE mg/dL
Hgb urine dipstick: NEGATIVE
Ketones, ur: 5 mg/dL — AB
Leukocytes,Ua: NEGATIVE
Nitrite: NEGATIVE
Protein, ur: 30 mg/dL — AB
Specific Gravity, Urine: 1.021 (ref 1.005–1.030)
pH: 6 (ref 5.0–8.0)

## 2019-10-15 LAB — BASIC METABOLIC PANEL
Anion gap: 11 (ref 5–15)
BUN: 10 mg/dL (ref 8–23)
CO2: 27 mmol/L (ref 22–32)
Calcium: 8.8 mg/dL — ABNORMAL LOW (ref 8.9–10.3)
Chloride: 97 mmol/L — ABNORMAL LOW (ref 98–111)
Creatinine, Ser: 1.14 mg/dL (ref 0.61–1.24)
GFR calc Af Amer: >60 mL/min (ref >60)
GFR calc non Af Amer: >60 mL/min (ref >60)
Glucose, Bld: 132 mg/dL — ABNORMAL HIGH (ref 70–99)
Potassium: 4 mmol/L (ref 3.5–5.1)
Sodium: 135 mmol/L (ref 135–145)

## 2019-10-15 LAB — CBC
HCT: 45.4 % (ref 39.0–52.0)
Hemoglobin: 14.2 g/dL (ref 13.0–17.0)
MCH: 28.5 pg (ref 26.0–34.0)
MCHC: 31.3 g/dL (ref 30.0–36.0)
MCV: 91.2 fL (ref 80.0–100.0)
Platelets: 218 10*3/uL (ref 150–400)
RBC: 4.98 MIL/uL (ref 4.22–5.81)
RDW: 14.2 % (ref 11.5–15.5)
WBC: 11.6 10*3/uL — ABNORMAL HIGH (ref 4.0–10.5)
nRBC: 0 % (ref 0.0–0.2)

## 2019-10-15 LAB — LIPASE, BLOOD: Lipase: 17 U/L (ref 11–51)

## 2019-10-15 MED ORDER — IOHEXOL 300 MG/ML  SOLN
100.0000 mL | Freq: Once | INTRAMUSCULAR | Status: AC | PRN
  Administered 2019-10-15: 100 mL via INTRAVENOUS

## 2019-10-15 MED ORDER — ONDANSETRON HCL 4 MG/2ML IJ SOLN
4.0000 mg | Freq: Once | INTRAMUSCULAR | Status: AC
  Administered 2019-10-15: 4 mg via INTRAVENOUS
  Filled 2019-10-15: qty 2

## 2019-10-15 MED ORDER — MORPHINE SULFATE (PF) 4 MG/ML IV SOLN
4.0000 mg | Freq: Once | INTRAVENOUS | Status: AC
  Administered 2019-10-15: 4 mg via INTRAVENOUS
  Filled 2019-10-15: qty 1

## 2019-10-15 NOTE — ED Notes (Signed)
Pt in bed, pt reports that he has been vomiting for the past 4 days.  Pt abd is distended, very tender on the R, pt reports that he has been vomiting every time he tries to eat or drink anything.  Pt has expiratory wheezes, pt on cardiac and O2 sat monitor.  Pt talking in full sentences, iv placed, blood and urine samples obtained, pt awaits md eval

## 2019-10-15 NOTE — ED Triage Notes (Addendum)
Patient states abdominal pain x 4 days. Patient states that he is unable to eat or drink. Patient states that has had vomiting every time that he tries to eat or drink. Patient does have some respiratory congestion and has a hx of COPD.

## 2019-10-16 ENCOUNTER — Encounter (HOSPITAL_COMMUNITY): Payer: Self-pay | Admitting: Internal Medicine

## 2019-10-16 ENCOUNTER — Inpatient Hospital Stay (HOSPITAL_COMMUNITY): Payer: Medicare HMO

## 2019-10-16 DIAGNOSIS — K810 Acute cholecystitis: Principal | ICD-10-CM | POA: Diagnosis present

## 2019-10-16 DIAGNOSIS — K760 Fatty (change of) liver, not elsewhere classified: Secondary | ICD-10-CM | POA: Diagnosis not present

## 2019-10-16 DIAGNOSIS — K8012 Calculus of gallbladder with acute and chronic cholecystitis without obstruction: Secondary | ICD-10-CM | POA: Diagnosis not present

## 2019-10-16 DIAGNOSIS — K828 Other specified diseases of gallbladder: Secondary | ICD-10-CM | POA: Diagnosis not present

## 2019-10-16 DIAGNOSIS — R1011 Right upper quadrant pain: Secondary | ICD-10-CM

## 2019-10-16 DIAGNOSIS — R05 Cough: Secondary | ICD-10-CM | POA: Diagnosis not present

## 2019-10-16 LAB — CBC WITH DIFFERENTIAL/PLATELET
Abs Immature Granulocytes: 0.03 10*3/uL (ref 0.00–0.07)
Basophils Absolute: 0 10*3/uL (ref 0.0–0.1)
Basophils Relative: 0 %
Eosinophils Absolute: 0.1 10*3/uL (ref 0.0–0.5)
Eosinophils Relative: 1 %
HCT: 41.2 % (ref 39.0–52.0)
Hemoglobin: 12.9 g/dL — ABNORMAL LOW (ref 13.0–17.0)
Immature Granulocytes: 0 %
Lymphocytes Relative: 19 %
Lymphs Abs: 1.5 10*3/uL (ref 0.7–4.0)
MCH: 28.4 pg (ref 26.0–34.0)
MCHC: 31.3 g/dL (ref 30.0–36.0)
MCV: 90.5 fL (ref 80.0–100.0)
Monocytes Absolute: 0.8 10*3/uL (ref 0.1–1.0)
Monocytes Relative: 10 %
Neutro Abs: 5.3 10*3/uL (ref 1.7–7.7)
Neutrophils Relative %: 70 %
Platelets: 192 10*3/uL (ref 150–400)
RBC: 4.55 MIL/uL (ref 4.22–5.81)
RDW: 14.3 % (ref 11.5–15.5)
WBC: 7.6 10*3/uL (ref 4.0–10.5)
nRBC: 0 % (ref 0.0–0.2)

## 2019-10-16 LAB — COMPREHENSIVE METABOLIC PANEL
ALT: 219 U/L — ABNORMAL HIGH (ref 0–44)
AST: 322 U/L — ABNORMAL HIGH (ref 15–41)
Albumin: 3.1 g/dL — ABNORMAL LOW (ref 3.5–5.0)
Alkaline Phosphatase: 137 U/L — ABNORMAL HIGH (ref 38–126)
Anion gap: 10 (ref 5–15)
BUN: 14 mg/dL (ref 8–23)
CO2: 27 mmol/L (ref 22–32)
Calcium: 8.2 mg/dL — ABNORMAL LOW (ref 8.9–10.3)
Chloride: 97 mmol/L — ABNORMAL LOW (ref 98–111)
Creatinine, Ser: 1.2 mg/dL (ref 0.61–1.24)
GFR calc Af Amer: >60 mL/min (ref >60)
GFR calc non Af Amer: >60 mL/min (ref >60)
Glucose, Bld: 126 mg/dL — ABNORMAL HIGH (ref 70–99)
Potassium: 4.4 mmol/L (ref 3.5–5.1)
Sodium: 134 mmol/L — ABNORMAL LOW (ref 135–145)
Total Bilirubin: 1.8 mg/dL — ABNORMAL HIGH (ref 0.3–1.2)
Total Protein: 6.5 g/dL (ref 6.5–8.1)

## 2019-10-16 LAB — HEPATIC FUNCTION PANEL
ALT: 39 U/L (ref 0–44)
AST: 25 U/L (ref 15–41)
Albumin: 3.9 g/dL (ref 3.5–5.0)
Alkaline Phosphatase: 103 U/L (ref 38–126)
Bilirubin, Direct: 0.1 mg/dL (ref 0.0–0.2)
Indirect Bilirubin: 0.8 mg/dL (ref 0.3–0.9)
Total Bilirubin: 0.9 mg/dL (ref 0.3–1.2)
Total Protein: 8.2 g/dL — ABNORMAL HIGH (ref 6.5–8.1)

## 2019-10-16 LAB — MAGNESIUM: Magnesium: 1.6 mg/dL — ABNORMAL LOW (ref 1.7–2.4)

## 2019-10-16 LAB — LIPASE, BLOOD: Lipase: 18 U/L (ref 11–51)

## 2019-10-16 LAB — HIV ANTIBODY (ROUTINE TESTING W REFLEX): HIV Screen 4th Generation wRfx: NONREACTIVE

## 2019-10-16 LAB — SARS CORONAVIRUS 2 (TAT 6-24 HRS): SARS Coronavirus 2: NEGATIVE

## 2019-10-16 MED ORDER — MAGNESIUM SULFATE 2 GM/50ML IV SOLN
2.0000 g | Freq: Once | INTRAVENOUS | Status: AC
  Administered 2019-10-16: 05:00:00 2 g via INTRAVENOUS
  Filled 2019-10-16: qty 50

## 2019-10-16 MED ORDER — ONDANSETRON HCL 4 MG/2ML IJ SOLN: 4.0000 mg | Freq: Four times a day (QID) | INTRAMUSCULAR | Status: DC | PRN

## 2019-10-16 MED ORDER — SODIUM CHLORIDE 0.9 % IV SOLN: INTRAVENOUS | Status: DC

## 2019-10-16 MED ORDER — PIPERACILLIN-TAZOBACTAM 3.375 G IVPB
3.3750 g | Freq: Three times a day (TID) | INTRAVENOUS | Status: AC
  Administered 2019-10-16 – 2019-10-18 (×9): 3.375 g via INTRAVENOUS
  Filled 2019-10-16 (×10): qty 50

## 2019-10-16 MED ORDER — IPRATROPIUM-ALBUTEROL 20-100 MCG/ACT IN AERS: 2.0000 | INHALATION_SPRAY | Freq: Four times a day (QID) | RESPIRATORY_TRACT | Status: DC | PRN

## 2019-10-16 MED ORDER — ACETAMINOPHEN 325 MG PO TABS
650.0000 mg | ORAL_TABLET | Freq: Four times a day (QID) | ORAL | Status: DC | PRN
  Administered 2019-10-16: 650 mg via ORAL
  Filled 2019-10-16: qty 2

## 2019-10-16 MED ORDER — PANTOPRAZOLE SODIUM 40 MG IV SOLR
40.0000 mg | Freq: Every day | INTRAVENOUS | Status: DC
  Administered 2019-10-16 – 2019-10-19 (×3): 40 mg via INTRAVENOUS
  Filled 2019-10-16 (×5): qty 40

## 2019-10-16 MED ORDER — BISACODYL 5 MG PO TBEC
5.0000 mg | DELAYED_RELEASE_TABLET | Freq: Once | ORAL | Status: AC
  Administered 2019-10-16: 03:00:00 5 mg via ORAL
  Filled 2019-10-16: qty 1

## 2019-10-16 MED ORDER — BUDESONIDE 0.5 MG/2ML IN SUSP
0.5000 mg | Freq: Two times a day (BID) | RESPIRATORY_TRACT | Status: DC
  Administered 2019-10-16 – 2019-10-19 (×5): 0.5 mg via RESPIRATORY_TRACT
  Filled 2019-10-16 (×6): qty 2

## 2019-10-16 MED ORDER — IPRATROPIUM-ALBUTEROL 0.5-2.5 (3) MG/3ML IN SOLN
3.0000 mL | Freq: Four times a day (QID) | RESPIRATORY_TRACT | Status: DC
  Administered 2019-10-16 – 2019-10-19 (×8): 3 mL via RESPIRATORY_TRACT
  Filled 2019-10-16 (×9): qty 3

## 2019-10-16 MED ORDER — MORPHINE SULFATE (PF) 4 MG/ML IV SOLN
4.0000 mg | INTRAVENOUS | Status: DC | PRN
  Administered 2019-10-16 – 2019-10-17 (×8): 4 mg via INTRAVENOUS
  Filled 2019-10-16 (×8): qty 1

## 2019-10-16 MED ORDER — ONDANSETRON HCL 4 MG PO TABS: 4.0000 mg | ORAL_TABLET | Freq: Four times a day (QID) | ORAL | Status: DC | PRN

## 2019-10-16 MED ORDER — MORPHINE SULFATE (PF) 4 MG/ML IV SOLN: 4.0000 mg | Freq: Once | INTRAVENOUS | Status: DC

## 2019-10-16 MED ORDER — FLUTICASONE FUROATE-VILANTEROL 100-25 MCG/INH IN AEPB: 1.0000 | INHALATION_SPRAY | Freq: Every day | RESPIRATORY_TRACT | Status: DC

## 2019-10-16 MED ORDER — FLUTICASONE FUROATE-VILANTEROL 100-25 MCG/INH IN AEPB
1.0000 | INHALATION_SPRAY | Freq: Every day | RESPIRATORY_TRACT | Status: DC
  Administered 2019-10-16: 1 via RESPIRATORY_TRACT
  Filled 2019-10-16: qty 28

## 2019-10-16 MED ORDER — ACETAMINOPHEN 650 MG RE SUPP: 650.0000 mg | Freq: Four times a day (QID) | RECTAL | Status: DC | PRN

## 2019-10-16 NOTE — Care Management Important Message (Signed)
Important Message  Patient Details  Name: Gordon Navarro MRN: [id number] Date of Birth: 1950/12/24   Medicare Important Message Given:  Yes     Annice Needy, LCSW 10/16/2019, 5:44 PM

## 2019-10-16 NOTE — Progress Notes (Addendum)
PROGRESS NOTE  Gordon Navarro GYI:[id number] DOB: 10/27/1950 DOA: 10/15/2019 PCP: Mechele Claude, MD  Brief History:  69 year old male with a history of COPD and no other documented chronic medical problems presenting with right upper quadrant abdominal pain with associated nausea and vomiting that began on 10/11/2019.  The patient has had subjective fevers and chills.  He denies any hematemesis, diarrhea, hematochezia, melena, dysuria, hematuria.  Because of worsening pain, he presented for further evaluation.  In the emergency department, the patient had a low-grade temperature of 99.7 F.  He was hemodynamically stable without hypoxia.  Lipase was 18 with normal LFTs initially.  CT of the abdomen and pelvis showed gallbladder distention.  Right upper quadrant ultrasound showed gallbladder filled with sludge and distended.  General surgery was consulted to assist with management.  Assessment/Plan: Acute cholecystitis -General surgery consult -Case discussed with general surgery, Dr. Lovell Sheehan -Continue Zosyn -Continue IV fluids -Abdominal pain worsened with clear liquids--> convert back to n.p.o. except for ice chips -Ultimately planning for cholecystectomy -UA without pyuria -10/16/2019 LFTs trending up -Repeat CMP in the morning -Judicious pain control -CT abdomen and right upper quadrant ultrasound as discussed above  COPD -Start Pulmicort -Start duo nebs -Stable on room air  Hypomagnesemia -Replete -Check magnesium again      Disposition Plan: Patient From: Home D/C Place: Home- 2-3  Days Barriers: Not Clinically Stable--needs cholecystectomy  Family Communication:   Family at bedside  Consultants:  General surgery  Code Status:  FULL   DVT Prophylaxis:  [place] Heparin   Procedures: As Listed in Progress Note Above  Antibiotics: Zosyn 4/20>>>  Total time spent 35 minutes.  Greater than 50% spent face to face counseling and coordinating  care.    Subjective: Patient continues to have epigastric and right upper quadrant abdominal pain.  He denies any fevers, chills, chest pain, vomiting.  He has nausea.  He has increasing pain with liquid intake.  He has not had any flatus.  Objective: Vitals:   10/16/19 0406 10/16/19 0811 10/16/19 0817 10/16/19 0900  BP: 120/70     Pulse: 80     Resp: 20     Temp: 99.1 F (37.3 C)     TempSrc: Oral     SpO2: 99% 100% 98% 98%  Weight:      Height:        Intake/Output Summary (Last 24 hours) at 10/16/2019 1630 Last data filed at 10/16/2019 1500 Gross per 24 hour  Intake 446.35 ml  Output --  Net 446.35 ml   Weight change:  Exam:   General:  Pt is alert, follows commands appropriately, not in acute distress  HEENT: No icterus, No thrush, No neck mass, [place]/AT  Cardiovascular: RRR, S1/S2, no rubs, no gallops  Respiratory: Bibasal expiratory wheeze.  Diminished breath sounds bilateral.  Bibasilar rales.  Abdomen: Soft/+BS, non tender, non distended, no guarding  Extremities: No edema, No lymphangitis, No petechiae, No rashes, no synovitis   Data Reviewed: I have personally reviewed following labs and imaging studies Basic Metabolic Panel: Recent Labs  Lab 10/15/19 2258 10/16/19 0818  NA 135 134*  K 4.0 4.4  CL 97* 97*  CO2 27 27  GLUCOSE 132* 126*  BUN 10 14  CREATININE 1.14 1.20  CALCIUM 8.8* 8.2*  MG 1.6*  --    Liver Function Tests: Recent Labs  Lab 10/15/19 2258 10/16/19 0818  AST 25 322*  ALT 39 219*  ALKPHOS 103  137*  BILITOT 0.9 1.8*  PROT 8.2* 6.5  ALBUMIN 3.9 3.1*   Recent Labs  Lab 10/15/19 2258  LIPASE 18  17   No results for input(s): AMMONIA in the last 168 hours. Coagulation Profile: No results for input(s): INR, PROTIME in the last 168 hours. CBC: Recent Labs  Lab 10/15/19 2258 10/16/19 0818  WBC 11.6* 7.6  NEUTROABS  --  5.3  HGB 14.2 12.9*  HCT 45.4 41.2  MCV 91.2 90.5  PLT 218 192   Cardiac Enzymes: No results  for input(s): CKTOTAL, CKMB, CKMBINDEX, TROPONINI in the last 168 hours. BNP: Invalid input(s): POCBNP CBG: No results for input(s): GLUCAP in the last 168 hours. HbA1C: No results for input(s): HGBA1C in the last 72 hours. Urine analysis:    Component Value Date/Time   COLORURINE YELLOW 10/15/2019 2240   APPEARANCEUR CLEAR 10/15/2019 2240   APPEARANCEUR Clear 08/28/2019 1157   LABSPEC 1.021 10/15/2019 2240   PHURINE 6.0 10/15/2019 2240   GLUCOSEU NEGATIVE 10/15/2019 2240   HGBUR NEGATIVE 10/15/2019 2240   BILIRUBINUR NEGATIVE 10/15/2019 2240   BILIRUBINUR Negative 08/28/2019 1157   KETONESUR 5 (A) 10/15/2019 2240   PROTEINUR 30 (A) 10/15/2019 2240   NITRITE NEGATIVE 10/15/2019 2240   LEUKOCYTESUR NEGATIVE 10/15/2019 2240   Sepsis Labs: @LABRCNTIP (procalcitonin:4,lacticidven:4) ) Recent Results (from the past 240 hour(s))  SARS CORONAVIRUS 2 (Ashaun Gaughan 6-24 HRS) Nasopharyngeal Nasopharyngeal Swab     Status: None   Collection Time: 10/16/19  1:59 AM   Specimen: Nasopharyngeal Swab  Result Value Ref Range Status   SARS Coronavirus 2 NEGATIVE NEGATIVE Final    Comment: (NOTE) SARS-CoV-2 target nucleic acids are NOT DETECTED. The SARS-CoV-2 RNA is generally detectable in upper and lower respiratory specimens during the acute phase of infection. Negative results do not preclude SARS-CoV-2 infection, do not rule out co-infections with other pathogens, and should not be used as the sole basis for treatment or other patient management decisions. Negative results must be combined with clinical observations, patient history, and epidemiological information. The expected result is Negative. Fact Sheet for Patients: SugarRoll.be Fact Sheet for Healthcare Providers: https://www.woods-mathews.com/ This test is not yet approved or cleared by the [place] FDA and  has been authorized for detection and/or diagnosis of SARS-CoV-2 by FDA under an  Emergency Use Authorization (EUA). This EUA will remain  in effect (meaning this test can be used) for the duration of the COVID-19 declaration under Section 56 4(b)(1) of the Act, 21 U.S.C. section 360bbb-3(b)(1), unless the authorization is terminated or revoked sooner. Performed at [facility], [place] [address], [place], [place] [address]      Scheduled Meds: . fluticasone furoate-vilanterol  1 puff Inhalation Daily  . pantoprazole (PROTONIX) IV  40 mg Intravenous Daily   Continuous Infusions: . sodium chloride 100 mL/hr at 10/16/19 0413  . piperacillin-tazobactam 3.375 g (10/16/19 0423)    Procedures/Studies: CT Abdomen Pelvis W Contrast  Result Date: 10/16/2019 CLINICAL DATA:  Abdominal distension EXAM: CT ABDOMEN AND PELVIS WITH CONTRAST TECHNIQUE: Multidetector CT imaging of the abdomen and pelvis was performed using the standard protocol following bolus administration of intravenous contrast. CONTRAST:  168mL OMNIPAQUE IOHEXOL 300 MG/ML  SOLN COMPARISON:  None. FINDINGS: Lower chest: Lung bases are clear. No effusions. Heart is normal size. Hepatobiliary: Gallbladder is distended. No focal hepatic abnormality. No biliary ductal dilatation. Pancreas: No focal abnormality or ductal dilatation. Spleen: No focal abnormality.  Normal size. Adrenals/Urinary Tract: No adrenal abnormality. No focal renal abnormality. No  stones or hydronephrosis. Urinary bladder is unremarkable. Stomach/Bowel: Stomach, large and small bowel grossly unremarkable. Normal appendix. Vascular/Lymphatic: Calcified and noncalcified irregular plaque throughout the aorta. No aneurysm or adenopathy. Reproductive: No visible focal abnormality. Other: No free fluid or free air. Musculoskeletal: No acute bony abnormality. IMPRESSION: Distention of the gallbladder. If there is clinical concern for cholecystitis, this could be further evaluated with right upper quadrant ultrasound. Aortic atherosclerosis. Electronically  Signed   By: Charlett Nose M.D.   On: 10/16/2019 00:10   DG Chest Port 1 View  Result Date: 10/16/2019 CLINICAL DATA:  Cough EXAM: PORTABLE CHEST 1 VIEW COMPARISON:  12/26/2018 FINDINGS: Heart and mediastinal contours are within normal limits. No focal opacities or effusions. No acute bony abnormality. IMPRESSION: No active disease. Electronically Signed   By: Charlett Nose M.D.   On: 10/16/2019 00:11   US Abdomen Limited RUQ  Result Date: 10/16/2019 CLINICAL DATA:  Acute cholecystitis, abdominal distension EXAM: ULTRASOUND ABDOMEN LIMITED RIGHT UPPER QUADRANT COMPARISON:  CT abdomen pelvis, 10/15/2019 FINDINGS: Gallbladder: The gallbladder is distended, maximum span 14.3 cm, and filled with echogenic sludge. No gallstones or wall thickening visualized. Positive sonographic Murphy sign noted by sonographer. Common bile duct: Diameter: 5 mm Liver: No focal lesion identified. Increased parenchymal echogenicity. Portal vein is patent on color Doppler imaging with normal direction of blood flow towards the liver. Other: None. IMPRESSION: 1. The gallbladder is distended, maximum span 14.3 cm, and filled with echogenic sludge. No discrete calculi are identified. No gallbladder wall thickening. No biliary ductal dilatation. Positive sonographic Murphy sign. Findings are concerning for acute cholecystitis. Patency of the cystic and common bile ducts may be evaluated by HIDA if desired. 2.  Hepatic steatosis. Electronically Signed   By: Lauralyn Primes M.D.   On: 10/16/2019 09:29    Catarina Hartshorn, DO  [facility]  If 7PM-7AM, please contact night-coverage [email or web address] Password [email or web address] 10/16/2019, 4:30 PM   LOS: 0 days

## 2019-10-16 NOTE — Care Management CC44 (Signed)
Condition Code 44 Documentation Completed  Patient Details  Name: Gordon Navarro MRN: [id number] Date of Birth: 11/09/1950   Condition Code 44 given:  Yes Patient signature on Condition Code 44 notice:  Yes Documentation of 2 MD's agreement:  Yes Code 44 added to claim:  Yes    Annice Needy, LCSW 10/16/2019, 5:44 PM

## 2019-10-16 NOTE — H&P (Signed)
History and Physical    Gordon Navarro VFI:[id number] DOB: May 23, 1951 DOA: 10/15/2019  PCP: Mechele Claude, MD   Patient coming from: Home.  I have personally briefly reviewed patient's old medical records in [facility]  Chief Complaint: Abdominal pain, nausea and vomiting.  HPI: Gordon Navarro is a 69 y.o. male with medical history significant of lung emphysema who is coming to the emergency department due to progressively worse right upper quadrant pain since Friday, although he mentions that he has been having on and off pain for more than a week.  He mentions that for the past 3 days he has been vomiting everything that he is tries to eat.  He has not had a bowel movement in 4 days because he has not been eating.  He denies melena, hematochezia, dysuria, frequency or hematuria.  He denies fever, chills, but feels fatigued.  No rhinorrhea, sore throat, hemoptysis, but he says he gets occasional wheezing and he has been having shallow inspiration due to RUQ pain.  Denies chest pain, palpitations, dizziness, diaphoresis, PND, orthopnea or pitting edema of the lower extremities.  No polyuria, polydipsia, polyphagia or blurred vision.  ED Course: Initial vital signs temperature 99.7 F, pulse 98, respiration 22, blood pressure 117/80 mmHg and O2 sat 100% on room air.  Patient received Dulcolax 5 mg p.o. x1, 4 mg of morphine IV, 4 mg of Zofran IV and magnesium supplementation.  Urinalysis showed ketonuria 05 and proteinuria 30 mg/dL.  CBC shows a white count 11.6, hemoglobin 14.2 g/dL and platelets 416.  BMP shows chloride 97 and glucose of 132.  All other values are normal when calcium is corrected to albumin.  Lipase was normal.  Magnesium was 1.6 mg/dL.  LFTs show total protein of 8.2 g/dL, but all other values are within normal limits.  Chest radiograph did not show any active disease.  CT abdomen/pelvis with contrast showed distention of the gallbladder, but no focal abnormality and no  biliary ductal dilatation.  Review of Systems: As per HPI otherwise all other systems reviewed and are negative.  Past Medical History:  Diagnosis Date  . Emphysema of lung [facility])     Past Surgical History:  Procedure Laterality Date  . SKIN GRAFT  1985   Left hand    Social History  reports that he has quit smoking. He has never used smokeless tobacco. He reports current alcohol use. He reports that he does not use drugs.  No Known Allergies  Family History  Problem Relation Age of Onset  . Heart attack Father   . Colon cancer Neg Hx    Prior to Admission medications   Medication Sig Start Date End Date Taking? Authorizing Provider  fluticasone furoate-vilanterol (BREO ELLIPTA) 100-25 MCG/INH AEPB Inhale 1 puff into the lungs daily. 08/28/19   Mechele Claude, MD  ibuprofen (ADVIL) 200 MG tablet Take 200-400 mg by mouth every 8 (eight) hours as needed (headache).    [provider]  Vitamin D, Ergocalciferol, (DRISDOL) 1.25 MG (50000 UNIT) CAPS capsule Take 1 capsule (50,000 Units total) by mouth every 7 (seven) days. 08/29/19   Mechele Claude, MD    Physical Exam: Vitals:   10/15/19 2235 10/15/19 2236 10/16/19 0406  BP: 117/80  120/70  Pulse: 98  80  Resp: (!) 22  20  Temp: 99.7 F (37.6 C)  99.1 F (37.3 C)  TempSrc: Oral  Oral  SpO2: 100%  99%  Weight:  72.6 kg   Height:  5\' 4"  (1.626  m)     Constitutional: NAD, calm, comfortable Eyes: PERRL, lids and conjunctivae normal ENMT: Mucous membranes are mildly dry.  Posterior pharynx clear of any exudate or lesions. Neck: normal, supple, no masses, no thyromegaly Respiratory: clear to auscultation bilaterally, no wheezing, no crackles.  Shallow nspiratory effort. No accessory muscle use.  Cardiovascular: Regular rate and rhythm, no murmurs / rubs / gallops. No extremity edema. 2+ pedal pulses. No carotid bruits.  Abdomen: Mildly distended with positive RUQ tenderness, no guarding or rebound tenderness, masses  palpated. No hepatosplenomegaly. Bowel sounds positive.  Musculoskeletal: no clubbing / cyanosis.  Good ROM, no contractures. Normal muscle tone.  Skin: no clinically significant rashes, lesions, ulcers on very limited dermatological examination. Neurologic: CN 2-12 grossly intact. Sensation intact, DTR normal. Strength 5/5 in all 4.  Psychiatric: Normal judgment and insight. Alert and oriented x 3. Normal mood.   Labs on Admission: I have personally reviewed following labs and imaging studies  CBC: Recent Labs  Lab 10/15/19 2258  WBC 11.6*  HGB 14.2  HCT 45.4  MCV 91.2  PLT 218    Basic Metabolic Panel: Recent Labs  Lab 10/15/19 2258  NA 135  K 4.0  CL 97*  CO2 27  GLUCOSE 132*  BUN 10  CREATININE 1.14  CALCIUM 8.8*  MG 1.6*    GFR: Estimated Creatinine Clearance: 58.2 mL/min (by C-G formula based on SCr of 1.14 mg/dL).  Liver Function Tests: Recent Labs  Lab 10/15/19 2258  AST 25  ALT 39  ALKPHOS 103  BILITOT 0.9  PROT 8.2*  ALBUMIN 3.9    Urine analysis:    Component Value Date/Time   COLORURINE YELLOW 10/15/2019 2240   APPEARANCEUR CLEAR 10/15/2019 2240   APPEARANCEUR Clear 08/28/2019 1157   LABSPEC 1.021 10/15/2019 2240   PHURINE 6.0 10/15/2019 2240   GLUCOSEU NEGATIVE 10/15/2019 2240   HGBUR NEGATIVE 10/15/2019 2240   BILIRUBINUR NEGATIVE 10/15/2019 2240   BILIRUBINUR Negative 08/28/2019 1157   KETONESUR 5 (A) 10/15/2019 2240   PROTEINUR 30 (A) 10/15/2019 2240   NITRITE NEGATIVE 10/15/2019 2240   LEUKOCYTESUR NEGATIVE 10/15/2019 2240    Radiological Exams on Admission: CT Abdomen Pelvis W Contrast  Result Date: 10/16/2019 CLINICAL DATA:  Abdominal distension EXAM: CT ABDOMEN AND PELVIS WITH CONTRAST TECHNIQUE: Multidetector CT imaging of the abdomen and pelvis was performed using the standard protocol following bolus administration of intravenous contrast. CONTRAST:  OMNIPAQUE IOHEXOL 300 MG/ML  SOLN COMPARISON:  None. FINDINGS: Lower  chest: Lung bases are clear. No effusions. Heart is normal size. Hepatobiliary: Gallbladder is distended. No focal hepatic abnormality. No biliary ductal dilatation. Pancreas: No focal abnormality or ductal dilatation. Spleen: No focal abnormality.  Normal size. Adrenals/Urinary Tract: No adrenal abnormality. No focal renal abnormality. No stones or hydronephrosis. Urinary bladder is unremarkable. Stomach/Bowel: Stomach, large and small bowel grossly unremarkable. Normal appendix. Vascular/Lymphatic: Calcified and noncalcified irregular plaque throughout the aorta. No aneurysm or adenopathy. Reproductive: No visible focal abnormality. Other: No free fluid or free air. Musculoskeletal: No acute bony abnormality. IMPRESSION: Distention of the gallbladder. If there is clinical concern for cholecystitis, this could be further evaluated with right upper quadrant ultrasound. Aortic atherosclerosis. Electronically Signed   By: Charlett Nose M.D.   On: 10/16/2019 00:10   DG Chest Port 1 View  Result Date: 10/16/2019 CLINICAL DATA:  Cough EXAM: PORTABLE CHEST 1 VIEW COMPARISON:  12/26/2018 FINDINGS: Heart and mediastinal contours are within normal limits. No focal opacities or effusions. No acute bony abnormality.  IMPRESSION: No active disease. Electronically Signed   By: Rolm Baptise M.D.   On: 10/16/2019 00:11    EKG: Independently reviewed.   Assessment/Plan Principal Problem:   Acute cholecystitis Admit to MedSurg/inpatient. Keep n.p.o.. Analgesics as needed. Antiemetics as needed. Protonix 40 mg IVP every 24 hours. Zosyn every 8 hours. Follow-up CBC, CMP. Check RUQ ultrasound. Consult general surgery.  Active Problems:   Pulmonary emphysema ([place]) Continue Breo Ellipta. Supplemental oxygen as needed. Bronchodilators as needed.    Hypomagnesemia Replacing. Follow-up magnesium level as needed.   DVT prophylaxis: SCDs. Code Status:   Full code. Family Communication: Disposition Plan:    Patient is from:  Home.  Anticipated DC to:  Home.  Anticipated DC date:  10/18/2019.  Anticipated DC barriers: Clinical improvement and surgical consult.  Consults called:  Routine general surgery consult. Admission status:  Inpatient/MedSurg.    Severity of Illness: Moderate severity.  Reubin Milan MD [facility]  How to contact the [facility] Attending or Consulting provider [place] or covering provider during after hours [place], for this patient?   1. Check the care team in [facility] and look for a) attending/consulting [facility] provider listed and b) the [facility] team listed 2. Log into [email or web address] and use 's universal password to access. If you do not have the password, please contact the hospital operator. 3. Locate the [facility] provider you are looking for under [facility] and page to a number that you can be directly reached. 4. If you still have difficulty reaching the provider, please page the [facility] (Director on Call) for the Hospitalists listed on amion for assistance.  10/16/2019, 4:55 AM   This document was prepared using Dragon voice recognition software and may contain some unintended transcription errors.

## 2019-10-16 NOTE — Consult Note (Signed)
Reason for Consult: Right upper quadrant abdominal pain Referring Physician: Dr. Arbutus Leas  Gordon Navarro is an 69 y.o. male.  HPI: Patient is a 69 year old white male with a significant history of emphysema who presents with a 4-day history of worsening right upper quadrant abdominal pain.  Patient states he has had this intermittently for the past month.  It is not associated with fatty foods.  He states the abdominal pain sometimes causes him not to be able to take a deep breath in.  He denies any fever, chills, jaundice.  CT scan of the abdomen revealed a distended gallbladder.  No stones were seen.  He was admitted to the hospital for pain control and further work-up.  Past Medical History:  Diagnosis Date  . Emphysema of lung [facility])     Past Surgical History:  Procedure Laterality Date  . SKIN GRAFT  1985   Left hand    Family History  Problem Relation Age of Onset  . Heart attack Father   . Colon cancer Neg Hx     Social History:  reports that he has quit smoking. He has never used smokeless tobacco. He reports current alcohol use. He reports that he does not use drugs.  Allergies: No Known Allergies  Medications:  Scheduled: . fluticasone furoate-vilanterol  1 puff Inhalation Daily  . pantoprazole (PROTONIX) IV  40 mg Intravenous Daily    Results for orders placed or performed during the hospital encounter of 10/15/19 (from the past 48 hour(s))  Urinalysis, Routine w reflex microscopic     Status: Abnormal   Collection Time: 10/15/19 10:40 PM  Result Value Ref Range   Color, Urine YELLOW YELLOW   APPearance CLEAR CLEAR   Specific Gravity, Urine 1.021 1.005 - 1.030   pH 6.0 5.0 - 8.0   Glucose, UA NEGATIVE NEGATIVE mg/dL   Hgb urine dipstick NEGATIVE NEGATIVE   Bilirubin Urine NEGATIVE NEGATIVE   Ketones, ur 5 (A) NEGATIVE mg/dL   Protein, ur 30 (A) NEGATIVE mg/dL   Nitrite NEGATIVE NEGATIVE   Leukocytes,Ua NEGATIVE NEGATIVE   RBC / HPF 0-5 0 - 5 RBC/hpf   WBC, UA 0-5  0 - 5 WBC/hpf   Bacteria, UA NONE SEEN NONE SEEN   Squamous Epithelial / LPF 0-5 0 - 5   Mucus PRESENT    Hyaline Casts, UA PRESENT     Comment: Performed at [facility], [address], [place], [place] [address]  CBC     Status: Abnormal   Collection Time: 10/15/19 10:58 PM  Result Value Ref Range   WBC 11.6 (H) 4.0 - 10.5 K/uL   RBC 4.98 4.22 - 5.81 MIL/uL   Hemoglobin 14.2 13.0 - 17.0 g/dL   HCT 07.3 71.0 - 62.6 %   MCV 91.2 80.0 - 100.0 fL   MCH 28.5 26.0 - 34.0 pg   MCHC 31.3 30.0 - 36.0 g/dL   RDW 94.8 54.6 - 27.0 %   Platelets 218 150 - 400 K/uL   nRBC 0.0 0.0 - 0.2 %    Comment: Performed at [facility], [address] [place], [place] [address]  Lipase, blood     Status: None   Collection Time: 10/15/19 10:58 PM  Result Value Ref Range   Lipase 17 11 - 51 U/L    Comment: Performed at [facility], [address] [place], [place] [address]  Basic metabolic panel     Status: Abnormal   Collection Time: 10/15/19 10:58 PM  Result Value Ref  Range   Sodium 135 135 - 145 mmol/L   Potassium 4.0 3.5 - 5.1 mmol/L   Chloride 97 (L) 98 - 111 mmol/L   CO2 27 22 - 32 mmol/L   Glucose, Bld 132 (H) 70 - 99 mg/dL    Comment: Glucose reference range applies only to samples taken after fasting for at least 8 hours.   BUN 10 8 - 23 mg/dL   Creatinine, Ser 0.92 0.61 - 1.24 mg/dL   Calcium 8.8 (L) 8.9 - 10.3 mg/dL   GFR calc non Af Amer >60 >60 mL/min   GFR calc Af Amer >60 >60 mL/min   Anion gap 11 5 - 15    Comment: Performed at [facility], [address], [place], [place] [address]  Lipase, blood     Status: None   Collection Time: 10/15/19 10:58 PM  Result Value Ref Range   Lipase 18 11 - 51 U/L    Comment: Performed at [facility], [address] [place], [place] [address]  Hepatic function panel     Status: Abnormal   Collection Time: 10/15/19 10:58 PM  Result Value Ref Range   Total Protein 8.2 (H) 6.5 - 8.1 g/dL   Albumin 3.9 3.5 - 5.0 g/dL   AST 25 15 - 41 U/L    ALT 39 0 - 44 U/L   Alkaline Phosphatase 103 38 - 126 U/L   Total Bilirubin 0.9 0.3 - 1.2 mg/dL   Bilirubin, Direct 0.1 0.0 - 0.2 mg/dL   Indirect Bilirubin 0.8 0.3 - 0.9 mg/dL    Comment: Performed at [facility], [address], [place], [place] [address]  Magnesium     Status: Abnormal   Collection Time: 10/15/19 10:58 PM  Result Value Ref Range   Magnesium 1.6 (L) 1.7 - 2.4 mg/dL    Comment: Performed at [facility], [address], [place], [place] [address]    CT Abdomen Pelvis W Contrast  Result Date: 10/16/2019 CLINICAL DATA:  Abdominal distension EXAM: CT ABDOMEN AND PELVIS WITH CONTRAST TECHNIQUE: Multidetector CT imaging of the abdomen and pelvis was performed using the standard protocol following bolus administration of intravenous contrast. CONTRAST:  OMNIPAQUE IOHEXOL 300 MG/ML  SOLN COMPARISON:  None. FINDINGS: Lower chest: Lung bases are clear. No effusions. Heart is normal size. Hepatobiliary: Gallbladder is distended. No focal hepatic abnormality. No biliary ductal dilatation. Pancreas: No focal abnormality or ductal dilatation. Spleen: No focal abnormality.  Normal size. Adrenals/Urinary Tract: No adrenal abnormality. No focal renal abnormality. No stones or hydronephrosis. Urinary bladder is unremarkable. Stomach/Bowel: Stomach, large and small bowel grossly unremarkable. Normal appendix. Vascular/Lymphatic: Calcified and noncalcified irregular plaque throughout the aorta. No aneurysm or adenopathy. Reproductive: No visible focal abnormality. Other: No free fluid or free air. Musculoskeletal: No acute bony abnormality. IMPRESSION: Distention of the gallbladder. If there is clinical concern for cholecystitis, this could be further evaluated with right upper quadrant ultrasound. Aortic atherosclerosis. Electronically Signed   By: Charlett Nose M.D.   On: 10/16/2019 00:10   DG Chest Port 1 View  Result Date: 10/16/2019 CLINICAL DATA:  Cough EXAM: PORTABLE CHEST 1 VIEW  COMPARISON:  12/26/2018 FINDINGS: Heart and mediastinal contours are within normal limits. No focal opacities or effusions. No acute bony abnormality. IMPRESSION: No active disease. Electronically Signed   By: Charlett Nose M.D.   On: 10/16/2019 00:11    ROS:  Pertinent items are noted in HPI.  Blood pressure 120/70, pulse 80, temperature 99.1 F (37.3 C),  temperature source Oral, resp. rate 20, height 5\' 4"  (1.626 m), weight 72.6 kg, SpO2 98 %. Physical Exam: Pleasant white male no acute distress Head is normocephalic, atraumatic Lungs with bilateral expiratory wheezing present.  Patient could not take deep inspiratory breaths due to discomfort.  No rhonchi noted. Abdomen is rotund but soft.  He is tender in the right upper quadrant to palpation.  Patient states his abdomen size is normal.  No rigidity is noted.  Labs and CT scan images personally reviewed  Assessment/Plan: Impression: Right upper quadrant abdominal pain, possible cholecystitis Emphysema Plan: Awaiting ultrasound results to determine course of therapy.  Further management is pending those results.  Aviva Signs 10/16/2019, 8:56 AM

## 2019-10-16 NOTE — ED Provider Notes (Signed)
[facility] EMERGENCY DEPARTMENT Provider Note   CSN: [id number] Arrival date & time: 10/15/19  2155     History Chief Complaint  Patient presents with  . Abdominal Pain    Gordon Navarro is a 69 y.o. male with a history of emphysema and prior history of intermittent RUQ pain presenting with a 4 day history of RUQ pain with radiation into his RLQ. He reports feeling very full and distended in his abdomen.  Pain is worse with palpation and movement. He denies back pain, dysuria, hematuria or flank pain.   In addition he reports nausea and vomiting with inability to maintain any PO intake.  He denies fever but has had chills. He does endorse occasional etoh use - last drank one mixed drink 5 days ago. He has found no alleviators for his pain. He denies diarrhea or constipation. He had a bowel movement this morning which did not affect his symptoms.   The history is provided by the patient.       Past Medical History:  Diagnosis Date  . Emphysema of lung [facility])     Patient Active Problem List   Diagnosis Date Noted  . Acute cholecystitis 10/16/2019  . Encounter for screening colonoscopy for non-high-risk patient 04/03/2019  . RUQ pain 04/03/2019  . Emphysema of lung ([place])   . Pulmonary emphysema ([place]) 03/06/2019    Past Surgical History:  Procedure Laterality Date  . SKIN GRAFT  1985   Left hand       Family History  Problem Relation Age of Onset  . Heart attack Father   . Colon cancer Neg Hx     Social History   Tobacco Use  . Smoking status: Former Research scientist (life sciences)  . Smokeless tobacco: Never Used  Substance Use Topics  . Alcohol use: Yes    Comment: 1 ounce of liquor some days  . Drug use: Never    Home Medications Prior to Admission medications   Medication Sig Start Date End Date Taking? Authorizing Provider  fluticasone furoate-vilanterol (BREO ELLIPTA) 100-25 MCG/INH AEPB Inhale 1 puff into the lungs daily. 08/28/19   Claretta Fraise, MD  ibuprofen (ADVIL) 200 MG  tablet Take 200-400 mg by mouth every 8 (eight) hours as needed (headache).    [provider]  Vitamin D, Ergocalciferol, (DRISDOL) 1.25 MG (50000 UNIT) CAPS capsule Take 1 capsule (50,000 Units total) by mouth every 7 (seven) days. 08/29/19   Claretta Fraise, MD    Allergies    Patient has no known allergies.  Review of Systems   Review of Systems  Constitutional: Negative for fever.  HENT: Negative.   Eyes: Negative.   Respiratory: Negative for chest tightness and shortness of breath.   Cardiovascular: Negative for chest pain.  Gastrointestinal: Positive for abdominal pain, nausea and vomiting. Negative for diarrhea.  Genitourinary: Negative.   Musculoskeletal: Negative for arthralgias, joint swelling and neck pain.  Skin: Negative.  Negative for rash and wound.  Neurological: Negative for dizziness, weakness, light-headedness, numbness and headaches.  Psychiatric/Behavioral: Negative.     Physical Exam Updated Vital Signs BP 117/80 (BP Location: Right Arm)   Pulse 98   Temp 99.7 F (37.6 C) (Oral)   Resp (!) 22   Ht 5\' 4"  (1.626 m)   Wt 72.6 kg   SpO2 100%   BMI 27.46 kg/m   Physical Exam Vitals and nursing note reviewed.  Constitutional:      Appearance: He is well-developed.  HENT:     Head: Normocephalic  and atraumatic.  Eyes:     Conjunctiva/sclera: Conjunctivae normal.  Cardiovascular:     Rate and Rhythm: Normal rate and regular rhythm.     Heart sounds: Normal heart sounds.  Pulmonary:     Effort: Pulmonary effort is normal.     Breath sounds: Rhonchi present. No wheezing.     Comments: Bilateral rhonchi bases Abdominal:     General: Bowel sounds are normal. There is distension.     Palpations: Abdomen is soft.     Tenderness: There is abdominal tenderness in the right upper quadrant and right lower quadrant. There is guarding. There is no right CVA tenderness or left CVA tenderness. Negative signs include McBurney's sign.  Musculoskeletal:         General: Normal range of motion.     Cervical back: Normal range of motion.  Skin:    General: Skin is warm and dry.  Neurological:     Mental Status: He is alert.     ED Results / Procedures / Treatments   Labs (all labs ordered are listed, but only abnormal results are displayed) Labs Reviewed  CBC - Abnormal; Notable for the following components:      Result Value   WBC 11.6 (*)    All other components within normal limits  URINALYSIS, ROUTINE W REFLEX MICROSCOPIC - Abnormal; Notable for the following components:   Ketones, ur 5 (*)    Protein, ur 30 (*)    All other components within normal limits  BASIC METABOLIC PANEL - Abnormal; Notable for the following components:   Chloride 97 (*)    Glucose, Bld 132 (*)    Calcium 8.8 (*)    All other components within normal limits  HEPATIC FUNCTION PANEL - Abnormal; Notable for the following components:   Total Protein 8.2 (*)    All other components within normal limits  SARS CORONAVIRUS 2 (TAT 6-24 HRS)  LIPASE, BLOOD  LIPASE, BLOOD    EKG None  Radiology CT Abdomen Pelvis W Contrast  Result Date: 10/16/2019 CLINICAL DATA:  Abdominal distension EXAM: CT ABDOMEN AND PELVIS WITH CONTRAST TECHNIQUE: Multidetector CT imaging of the abdomen and pelvis was performed using the standard protocol following bolus administration of intravenous contrast. CONTRAST:  OMNIPAQUE IOHEXOL 300 MG/ML  SOLN COMPARISON:  None. FINDINGS: Lower chest: Lung bases are clear. No effusions. Heart is normal size. Hepatobiliary: Gallbladder is distended. No focal hepatic abnormality. No biliary ductal dilatation. Pancreas: No focal abnormality or ductal dilatation. Spleen: No focal abnormality.  Normal size. Adrenals/Urinary Tract: No adrenal abnormality. No focal renal abnormality. No stones or hydronephrosis. Urinary bladder is unremarkable. Stomach/Bowel: Stomach, large and small bowel grossly unremarkable. Normal appendix. Vascular/Lymphatic:  Calcified and noncalcified irregular plaque throughout the aorta. No aneurysm or adenopathy. Reproductive: No visible focal abnormality. Other: No free fluid or free air. Musculoskeletal: No acute bony abnormality. IMPRESSION: Distention of the gallbladder. If there is clinical concern for cholecystitis, this could be further evaluated with right upper quadrant ultrasound. Aortic atherosclerosis. Electronically Signed   By: Charlett Nose M.D.   On: 10/16/2019 00:10   DG Chest Port 1 View  Result Date: 10/16/2019 CLINICAL DATA:  Cough EXAM: PORTABLE CHEST 1 VIEW COMPARISON:  12/26/2018 FINDINGS: Heart and mediastinal contours are within normal limits. No focal opacities or effusions. No acute bony abnormality. IMPRESSION: No active disease. Electronically Signed   By: Charlett Nose M.D.   On: 10/16/2019 00:11    Procedures Procedures (including critical care time)  Medications Ordered in ED Medications  bisacodyl (DULCOLAX) EC tablet 5 mg (has no administration in time range)  piperacillin-tazobactam (ZOSYN) IVPB 3.375 g (has no administration in time range)  ondansetron (ZOFRAN) injection 4 mg (4 mg Intravenous Given 10/15/19 2331)  morphine 4 MG/ML injection 4 mg (4 mg Intravenous Given 10/15/19 2333)  iohexol (OMNIPAQUE) 300 MG/ML solution 100 mL (100 mLs Intravenous Contrast Given 10/15/19 2346)    ED Course  I have reviewed the triage vital signs and the nursing notes.  Pertinent labs & imaging results that were available during my care of the patient were reviewed by me and considered in my medical decision making (see chart for details).    MDM Rules/Calculators/A&P                      Pt was given morphine and zofran with resolution of nausea but only transient relief of abd pain. Still with pain and guarding RUQ>RLQ.  CT imaging suggesting gallbladder distension, but normal LFT's and lipase.  Moderate right sided constipation but pain out of proportion for simple constipation.   Dulcolax ordered.  Pt may require overnight observation for pain control in anticipation of gallbladder US to further evaluate as this being the possible source of pain. Labs unremarkable except for wbc elevation of 11.6.     Discussed with Dr. Robb Matar who accepts pt for admission. Covid 19 test ordered.  Final Clinical Impression(s) / ED Diagnoses Final diagnoses:  Acute cholecystitis    Rx / DC Orders ED Discharge Orders    None       Victoriano Lain 10/16/19 Irene Pap, MD 10/16/19 [phone]

## 2019-10-17 ENCOUNTER — Observation Stay (HOSPITAL_COMMUNITY): Payer: Medicare HMO

## 2019-10-17 DIAGNOSIS — J441 Chronic obstructive pulmonary disease with (acute) exacerbation: Secondary | ICD-10-CM

## 2019-10-17 DIAGNOSIS — K810 Acute cholecystitis: Secondary | ICD-10-CM | POA: Diagnosis not present

## 2019-10-17 DIAGNOSIS — K8012 Calculus of gallbladder with acute and chronic cholecystitis without obstruction: Secondary | ICD-10-CM | POA: Diagnosis not present

## 2019-10-17 DIAGNOSIS — R140 Abdominal distension (gaseous): Secondary | ICD-10-CM | POA: Diagnosis not present

## 2019-10-17 DIAGNOSIS — R109 Unspecified abdominal pain: Secondary | ICD-10-CM | POA: Diagnosis not present

## 2019-10-17 DIAGNOSIS — R1011 Right upper quadrant pain: Secondary | ICD-10-CM | POA: Diagnosis not present

## 2019-10-17 LAB — CBC
HCT: 39.4 % (ref 39.0–52.0)
Hemoglobin: 12.2 g/dL — ABNORMAL LOW (ref 13.0–17.0)
MCH: 28.6 pg (ref 26.0–34.0)
MCHC: 31 g/dL (ref 30.0–36.0)
MCV: 92.5 fL (ref 80.0–100.0)
Platelets: 199 10*3/uL (ref 150–400)
RBC: 4.26 MIL/uL (ref 4.22–5.81)
RDW: 14.3 % (ref 11.5–15.5)
WBC: 9.9 10*3/uL (ref 4.0–10.5)
nRBC: 0 % (ref 0.0–0.2)

## 2019-10-17 LAB — BASIC METABOLIC PANEL
Anion gap: 9 (ref 5–15)
BUN: 11 mg/dL (ref 8–23)
CO2: 27 mmol/L (ref 22–32)
Calcium: 8.3 mg/dL — ABNORMAL LOW (ref 8.9–10.3)
Chloride: 97 mmol/L — ABNORMAL LOW (ref 98–111)
Creatinine, Ser: 1.19 mg/dL (ref 0.61–1.24)
GFR calc Af Amer: >60 mL/min (ref >60)
GFR calc non Af Amer: >60 mL/min (ref >60)
Glucose, Bld: 118 mg/dL — ABNORMAL HIGH (ref 70–99)
Potassium: 4.4 mmol/L (ref 3.5–5.1)
Sodium: 133 mmol/L — ABNORMAL LOW (ref 135–145)

## 2019-10-17 LAB — HEPATIC FUNCTION PANEL
ALT: 154 U/L — ABNORMAL HIGH (ref 0–44)
AST: 96 U/L — ABNORMAL HIGH (ref 15–41)
Albumin: 3.1 g/dL — ABNORMAL LOW (ref 3.5–5.0)
Alkaline Phosphatase: 144 U/L — ABNORMAL HIGH (ref 38–126)
Bilirubin, Direct: 0.4 mg/dL — ABNORMAL HIGH (ref 0.0–0.2)
Indirect Bilirubin: 0.8 mg/dL (ref 0.3–0.9)
Total Bilirubin: 1.2 mg/dL (ref 0.3–1.2)
Total Protein: 7 g/dL (ref 6.5–8.1)

## 2019-10-17 MED ORDER — METHYLPREDNISOLONE SODIUM SUCC 125 MG IJ SOLR
60.0000 mg | Freq: Every day | INTRAMUSCULAR | Status: DC
  Administered 2019-10-17 – 2019-10-19 (×2): 60 mg via INTRAVENOUS
  Filled 2019-10-17 (×3): qty 2

## 2019-10-17 MED ORDER — BISACODYL 10 MG RE SUPP
10.0000 mg | Freq: Once | RECTAL | Status: AC
  Administered 2019-10-17: 15:00:00 10 mg via RECTAL
  Filled 2019-10-17: qty 1

## 2019-10-17 MED ORDER — CHLORHEXIDINE GLUCONATE CLOTH 2 % EX PADS
6.0000 | MEDICATED_PAD | Freq: Once | CUTANEOUS | Status: AC
  Administered 2019-10-18: 07:00:00 6 via TOPICAL

## 2019-10-17 MED ORDER — CHLORHEXIDINE GLUCONATE CLOTH 2 % EX PADS
6.0000 | MEDICATED_PAD | Freq: Once | CUTANEOUS | Status: AC
  Administered 2019-10-17: 6 via TOPICAL

## 2019-10-17 NOTE — H&P (View-Only) (Signed)
Subjective: Complains of right upper quadrant abdominal pain, slightly less than yesterday.  Objective: Vital signs in last 24 hours: Temp:  [98 F (36.7 C)-99.5 F (37.5 C)] 99.5 F (37.5 C) (04/22 0545) Pulse Rate:  [75-79] 79 (04/22 0548) Resp:  [18-20] 20 (04/22 0545) BP: (112-134)/(65-82) 112/82 (04/22 0545) SpO2:  [85 %-98 %] 90 % (04/22 0731) FiO2 (%):  [24 %] 24 % (04/21 0900) Last BM Date: 10/12/19  Intake/Output from previous day: 04/21 0701 - 04/22 0700 In: 1386.9 [P.O.:360; I.V.:887.6; IV Piggyback:139.3] Out: -  Intake/Output this shift: No intake/output data recorded.  General appearance: alert, cooperative and no distress Resp: Decreased expiratory wheezing noted as compared to yesterday. GI: Soft, rotund.  Mild tenderness to palpation in the right upper quadrant.  Bowel sounds present.  Lab Results:  Recent Labs    10/16/19 0818 10/17/19 0552  WBC 7.6 9.9  HGB 12.9* 12.2*  HCT 41.2 39.4  PLT 192 199   BMET Recent Labs    10/16/19 0818 10/17/19 0552  NA 134* 133*  K 4.4 4.4  CL 97* 97*  CO2 27 27  GLUCOSE 126* 118*  BUN 14 11  CREATININE 1.20 1.19  CALCIUM 8.2* 8.3*   PT/INR No results for input(s): LABPROT, INR in the last 72 hours.  Studies/Results: CT Abdomen Pelvis W Contrast  Result Date: 10/16/2019 CLINICAL DATA:  Abdominal distension EXAM: CT ABDOMEN AND PELVIS WITH CONTRAST TECHNIQUE: Multidetector CT imaging of the abdomen and pelvis was performed using the standard protocol following bolus administration of intravenous contrast. CONTRAST:  100mL OMNIPAQUE IOHEXOL 300 MG/ML  SOLN COMPARISON:  None. FINDINGS: Lower chest: Lung bases are clear. No effusions. Heart is normal size. Hepatobiliary: Gallbladder is distended. No focal hepatic abnormality. No biliary ductal dilatation. Pancreas: No focal abnormality or ductal dilatation. Spleen: No focal abnormality.  Normal size. Adrenals/Urinary Tract: No adrenal abnormality. No focal  renal abnormality. No stones or hydronephrosis. Urinary bladder is unremarkable. Stomach/Bowel: Stomach, large and small bowel grossly unremarkable. Normal appendix. Vascular/Lymphatic: Calcified and noncalcified irregular plaque throughout the aorta. No aneurysm or adenopathy. Reproductive: No visible focal abnormality. Other: No free fluid or free air. Musculoskeletal: No acute bony abnormality. IMPRESSION: Distention of the gallbladder. If there is clinical concern for cholecystitis, this could be further evaluated with right upper quadrant ultrasound. Aortic atherosclerosis. Electronically Signed   By: Kevin  Dover M.D.   On: 10/16/2019 00:10   DG Chest Port 1 View  Result Date: 10/16/2019 CLINICAL DATA:  Cough EXAM: PORTABLE CHEST 1 VIEW COMPARISON:  12/26/2018 FINDINGS: Heart and mediastinal contours are within normal limits. No focal opacities or effusions. No acute bony abnormality. IMPRESSION: No active disease. Electronically Signed   By: Kevin  Dover M.D.   On: 10/16/2019 00:11   US Abdomen Limited RUQ  Result Date: 10/16/2019 CLINICAL DATA:  Acute cholecystitis, abdominal distension EXAM: ULTRASOUND ABDOMEN LIMITED RIGHT UPPER QUADRANT COMPARISON:  CT abdomen pelvis, 10/15/2019 FINDINGS: Gallbladder: The gallbladder is distended, maximum span 14.3 cm, and filled with echogenic sludge. No gallstones or wall thickening visualized. Positive sonographic Murphy sign noted by sonographer. Common bile duct: Diameter: 5 mm Liver: No focal lesion identified. Increased parenchymal echogenicity. Portal vein is patent on color Doppler imaging with normal direction of blood flow towards the liver. Other: None. IMPRESSION: 1. The gallbladder is distended, maximum span 14.3 cm, and filled with echogenic sludge. No discrete calculi are identified. No gallbladder wall thickening. No biliary ductal dilatation. Positive sonographic Murphy sign. Findings are concerning for acute   cholecystitis. Patency of the cystic  and common bile ducts may be evaluated by HIDA if desired. 2.  Hepatic steatosis. Electronically Signed   By: Lauralyn Primes M.D.   On: 10/16/2019 09:29    Anti-infectives: Anti-infectives (From admission, onward)   Start     Dose/Rate Route Frequency Ordered Stop   10/16/19 0215  piperacillin-tazobactam (ZOSYN) IVPB 3.375 g     3.375 g 12.5 mL/hr over 240 Minutes Intravenous Every 8 hours 10/16/19 0154        Assessment/Plan: Impression: Cholecystitis, cholelithiasis, emphysema Plan: We will proceed with laparoscopic cholecystectomy, possible open tomorrow.  The risks and benefits of the procedure including bleeding, infection, cardiopulmonary difficulties, hepatobiliary injury, and the possibility of an open procedure were fully explained to the patient, who gave informed consent.  LOS: 1 day    Gordon Navarro 10/17/2019

## 2019-10-17 NOTE — Progress Notes (Signed)
Subjective: Complains of right upper quadrant abdominal pain, slightly less than yesterday.  Objective: Vital signs in last 24 hours: Temp:  [98 F (36.7 C)-99.5 F (37.5 C)] 99.5 F (37.5 C) (04/22 0545) Pulse Rate:  [75-79] 79 (04/22 0548) Resp:  [18-20] 20 (04/22 0545) BP: (112-134)/(65-82) 112/82 (04/22 0545) SpO2:  [85 %-98 %] 90 % (04/22 0731) FiO2 (%):  [24 %] 24 % (04/21 0900) Last BM Date: 10/12/19  Intake/Output from previous day: 04/21 0701 - 04/22 0700 In: 1386.9 [P.O.:360; I.V.:887.6; IV Piggyback:139.3] Out: -  Intake/Output this shift: No intake/output data recorded.  General appearance: alert, cooperative and no distress Resp: Decreased expiratory wheezing noted as compared to yesterday. GI: Soft, rotund.  Mild tenderness to palpation in the right upper quadrant.  Bowel sounds present.  Lab Results:  Recent Labs    10/16/19 0818 10/17/19 0552  WBC 7.6 9.9  HGB 12.9* 12.2*  HCT 41.2 39.4  PLT 192 199   BMET Recent Labs    10/16/19 0818 10/17/19 0552  NA 134* 133*  K 4.4 4.4  CL 97* 97*  CO2 27 27  GLUCOSE 126* 118*  BUN 14 11  CREATININE 1.20 1.19  CALCIUM 8.2* 8.3*   PT/INR No results for input(s): LABPROT, INR in the last 72 hours.  Studies/Results: CT Abdomen Pelvis W Contrast  Result Date: 10/16/2019 CLINICAL DATA:  Abdominal distension EXAM: CT ABDOMEN AND PELVIS WITH CONTRAST TECHNIQUE: Multidetector CT imaging of the abdomen and pelvis was performed using the standard protocol following bolus administration of intravenous contrast. CONTRAST:  165mL OMNIPAQUE IOHEXOL 300 MG/ML  SOLN COMPARISON:  None. FINDINGS: Lower chest: Lung bases are clear. No effusions. Heart is normal size. Hepatobiliary: Gallbladder is distended. No focal hepatic abnormality. No biliary ductal dilatation. Pancreas: No focal abnormality or ductal dilatation. Spleen: No focal abnormality.  Normal size. Adrenals/Urinary Tract: No adrenal abnormality. No focal  renal abnormality. No stones or hydronephrosis. Urinary bladder is unremarkable. Stomach/Bowel: Stomach, large and small bowel grossly unremarkable. Normal appendix. Vascular/Lymphatic: Calcified and noncalcified irregular plaque throughout the aorta. No aneurysm or adenopathy. Reproductive: No visible focal abnormality. Other: No free fluid or free air. Musculoskeletal: No acute bony abnormality. IMPRESSION: Distention of the gallbladder. If there is clinical concern for cholecystitis, this could be further evaluated with right upper quadrant ultrasound. Aortic atherosclerosis. Electronically Signed   By: Rolm Baptise M.D.   On: 10/16/2019 00:10   DG Chest Port 1 View  Result Date: 10/16/2019 CLINICAL DATA:  Cough EXAM: PORTABLE CHEST 1 VIEW COMPARISON:  12/26/2018 FINDINGS: Heart and mediastinal contours are within normal limits. No focal opacities or effusions. No acute bony abnormality. IMPRESSION: No active disease. Electronically Signed   By: Rolm Baptise M.D.   On: 10/16/2019 00:11   US Abdomen Limited RUQ  Result Date: 10/16/2019 CLINICAL DATA:  Acute cholecystitis, abdominal distension EXAM: ULTRASOUND ABDOMEN LIMITED RIGHT UPPER QUADRANT COMPARISON:  CT abdomen pelvis, 10/15/2019 FINDINGS: Gallbladder: The gallbladder is distended, maximum span 14.3 cm, and filled with echogenic sludge. No gallstones or wall thickening visualized. Positive sonographic Murphy sign noted by sonographer. Common bile duct: Diameter: 5 mm Liver: No focal lesion identified. Increased parenchymal echogenicity. Portal vein is patent on color Doppler imaging with normal direction of blood flow towards the liver. Other: None. IMPRESSION: 1. The gallbladder is distended, maximum span 14.3 cm, and filled with echogenic sludge. No discrete calculi are identified. No gallbladder wall thickening. No biliary ductal dilatation. Positive sonographic Murphy sign. Findings are concerning for acute  cholecystitis. Patency of the cystic  and common bile ducts may be evaluated by HIDA if desired. 2.  Hepatic steatosis. Electronically Signed   By: Lauralyn Primes M.D.   On: 10/16/2019 09:29    Anti-infectives: Anti-infectives (From admission, onward)   Start     Dose/Rate Route Frequency Ordered Stop   10/16/19 0215  piperacillin-tazobactam (ZOSYN) IVPB 3.375 g     3.375 g 12.5 mL/hr over 240 Minutes Intravenous Every 8 hours 10/16/19 0154        Assessment/Plan: Impression: Cholecystitis, cholelithiasis, emphysema Plan: We will proceed with laparoscopic cholecystectomy, possible open tomorrow.  The risks and benefits of the procedure including bleeding, infection, cardiopulmonary difficulties, hepatobiliary injury, and the possibility of an open procedure were fully explained to the patient, who gave informed consent.  LOS: 1 day    Gordon Navarro 10/17/2019

## 2019-10-17 NOTE — Progress Notes (Signed)
PROGRESS NOTE  Gordon Navarro TKZ:[id number] DOB: August 01, 1950 DOA: 10/15/2019 PCP: Mechele Claude, MD   Brief History:  69 year old male with a history of COPD and no other documented chronic medical problems presenting with right upper quadrant abdominal pain with associated nausea and vomiting that began on 10/11/2019.  The patient has had subjective fevers and chills.  He denies any hematemesis, diarrhea, hematochezia, melena, dysuria, hematuria.  Because of worsening pain, he presented for further evaluation.  In the emergency department, the patient had a low-grade temperature of 99.7 F.  He was hemodynamically stable without hypoxia.  Lipase was 18 with normal LFTs initially.  CT of the abdomen and pelvis showed gallbladder distention.  Right upper quadrant ultrasound showed gallbladder filled with sludge and distended.  General surgery was consulted to assist with management.  Assessment/Plan: Acute cholecystitis -General surgery consult -Case discussed with general surgery, Dr. Lovell Sheehan -Continue Zosyn -Continue IV fluids -Abdominal pain worsened with clear liquids--> convert back to n.p.o. except for ice chips -10/18/19 planning for cholecystectomy -UA without pyuria -10/17/2019 LFTs trending back down -Repeat CMP in the morning -Judicious pain control -CT abdomen and right upper quadrant ultrasound as discussed above  COPD Exacerbation -c/o more sob 4/22 -start IV solumedrol -Continue Pulmicort -continue duo nebs -Stable on room air--92-93%  Hypomagnesemia -Replete -Check magnesium again  Constipation -personally reviewed AXR--sm and large bowel gas -bisacodyl suppository     Disposition Plan: Patient From: Home D/C Place: Home- 1-2  Days Barriers: Not Clinically Stable--needs cholecystectomy  Family Communication:  no Family at bedside  Consultants:  General surgery  Code Status:  FULL   DVT Prophylaxis:  [place]  Heparin   Procedures: As Listed in Progress Note Above  Antibiotics: Zosyn 4/20>>>  Total time spent 35 minutes.  Greater than 50% spent face to face counseling and coordinating care.    Subjective: Pt complains of some more sob.  States abd pain is not better.  No n/v.  Passing flatus, but no BM.  Denies cp, f/c, headache.  Objective: Vitals:   10/17/19 0434 10/17/19 0545 10/17/19 0548 10/17/19 0731  BP: 134/76 112/82    Pulse: 78 77 79   Resp: 20 20    Temp: 98 F (36.7 C) 99.5 F (37.5 C)    TempSrc: Oral Oral    SpO2: 93% (!) 85% 91% 90%  Weight:      Height:        Intake/Output Summary (Last 24 hours) at 10/17/2019 1504 Last data filed at 10/17/2019 1240 Gross per 24 hour  Intake 1643.12 ml  Output --  Net 1643.12 ml   Weight change:  Exam:   General:  Pt is alert, follows commands appropriately, not in acute distress  HEENT: No icterus, No thrush, No neck mass, [place]/AT  Cardiovascular: RRR, S1/S2, no rubs, no gallops  Respiratory: diminished BS bilateral.  Bibasilar exp wheeze.  Bibasilar rales  Abdomen: Soft/+BS, non tender, non distended, no guarding  Extremities: No edema, No lymphangitis, No petechiae, No rashes, no synovitis   Data Reviewed: I have personally reviewed following labs and imaging studies Basic Metabolic Panel: Recent Labs  Lab 10/15/19 2258 10/16/19 0818 10/17/19 0552  NA 135 134* 133*  K 4.0 4.4 4.4  CL 97* 97* 97*  CO2 27 27 27   GLUCOSE 132* 126* 118*  BUN 10 14 11   CREATININE 1.14 1.20 1.19  CALCIUM 8.8* 8.2* 8.3*  MG 1.6*  --   --  Liver Function Tests: Recent Labs  Lab 10/15/19 2258 10/16/19 0818 10/17/19 0552  AST 25 322* 96*  ALT 39 219* 154*  ALKPHOS 103 137* 144*  BILITOT 0.9 1.8* 1.2  PROT 8.2* 6.5 7.0  ALBUMIN 3.9 3.1* 3.1*   Recent Labs  Lab 10/15/19 2258  LIPASE 18  17   No results for input(s): AMMONIA in the last 168 hours. Coagulation Profile: No results for input(s): INR,  PROTIME in the last 168 hours. CBC: Recent Labs  Lab 10/15/19 2258 10/16/19 0818 10/17/19 0552  WBC 11.6* 7.6 9.9  NEUTROABS  --  5.3  --   HGB 14.2 12.9* 12.2*  HCT 45.4 41.2 39.4  MCV 91.2 90.5 92.5  PLT 218 192 199   Cardiac Enzymes: No results for input(s): CKTOTAL, CKMB, CKMBINDEX, TROPONINI in the last 168 hours. BNP: Invalid input(s): POCBNP CBG: No results for input(s): GLUCAP in the last 168 hours. HbA1C: No results for input(s): HGBA1C in the last 72 hours. Urine analysis:    Component Value Date/Time   COLORURINE YELLOW 10/15/2019 2240   APPEARANCEUR CLEAR 10/15/2019 2240   APPEARANCEUR Clear 08/28/2019 1157   LABSPEC 1.021 10/15/2019 2240   PHURINE 6.0 10/15/2019 2240   GLUCOSEU NEGATIVE 10/15/2019 2240   HGBUR NEGATIVE 10/15/2019 2240   BILIRUBINUR NEGATIVE 10/15/2019 2240   BILIRUBINUR Negative 08/28/2019 1157   KETONESUR 5 (A) 10/15/2019 2240   PROTEINUR 30 (A) 10/15/2019 2240   NITRITE NEGATIVE 10/15/2019 2240   LEUKOCYTESUR NEGATIVE 10/15/2019 2240   Sepsis Labs: @LABRCNTIP (procalcitonin:4,lacticidven:4) ) Recent Results (from the past 240 hour(s))  SARS CORONAVIRUS 2 ([place] 6-24 HRS) Nasopharyngeal Nasopharyngeal Swab     Status: None   Collection Time: 10/16/19  1:59 AM   Specimen: Nasopharyngeal Swab  Result Value Ref Range Status   SARS Coronavirus 2 NEGATIVE NEGATIVE Final    Comment: (NOTE) SARS-CoV-2 target nucleic acids are NOT DETECTED. The SARS-CoV-2 RNA is generally detectable in upper and lower respiratory specimens during the acute phase of infection. Negative results do not preclude SARS-CoV-2 infection, do not rule out co-infections with other pathogens, and should not be used as the sole basis for treatment or other patient management decisions. Negative results must be combined with clinical observations, patient history, and epidemiological information. The expected result is Negative. Fact Sheet for  Patients: SugarRoll.be Fact Sheet for Healthcare Providers: https://www.woods-mathews.com/ This test is not yet approved or cleared by the [place] FDA and  has been authorized for detection and/or diagnosis of SARS-CoV-2 by FDA under an Emergency Use Authorization (EUA). This EUA will remain  in effect (meaning this test can be used) for the duration of the COVID-19 declaration under Section 56 4(b)(1) of the Act, 21 U.S.C. section 360bbb-3(b)(1), unless the authorization is terminated or revoked sooner. Performed at [facility], [place] [address], [place], [place] [address]      Scheduled Meds: . budesonide (PULMICORT) nebulizer solution  0.5 mg Nebulization BID  . ipratropium-albuterol  3 mL Nebulization Q6H  . pantoprazole (PROTONIX) IV  40 mg Intravenous Daily   Continuous Infusions: . sodium chloride Stopped (10/16/19 2207)  . sodium chloride 75 mL/hr at 10/17/19 1240  . piperacillin-tazobactam 3.375 g (10/17/19 1443)    Procedures/Studies: CT Abdomen Pelvis W Contrast  Result Date: 10/16/2019 CLINICAL DATA:  Abdominal distension EXAM: CT ABDOMEN AND PELVIS WITH CONTRAST TECHNIQUE: Multidetector CT imaging of the abdomen and pelvis was performed using the standard protocol following bolus administration of intravenous contrast. CONTRAST:  159mL OMNIPAQUE  IOHEXOL 300 MG/ML  SOLN COMPARISON:  None. FINDINGS: Lower chest: Lung bases are clear. No effusions. Heart is normal size. Hepatobiliary: Gallbladder is distended. No focal hepatic abnormality. No biliary ductal dilatation. Pancreas: No focal abnormality or ductal dilatation. Spleen: No focal abnormality.  Normal size. Adrenals/Urinary Tract: No adrenal abnormality. No focal renal abnormality. No stones or hydronephrosis. Urinary bladder is unremarkable. Stomach/Bowel: Stomach, large and small bowel grossly unremarkable. Normal appendix. Vascular/Lymphatic: Calcified and  noncalcified irregular plaque throughout the aorta. No aneurysm or adenopathy. Reproductive: No visible focal abnormality. Other: No free fluid or free air. Musculoskeletal: No acute bony abnormality. IMPRESSION: Distention of the gallbladder. If there is clinical concern for cholecystitis, this could be further evaluated with right upper quadrant ultrasound. Aortic atherosclerosis. Electronically Signed   By: Charlett Nose M.D.   On: 10/16/2019 00:10   DG Chest Port 1 View  Result Date: 10/16/2019 CLINICAL DATA:  Cough EXAM: PORTABLE CHEST 1 VIEW COMPARISON:  12/26/2018 FINDINGS: Heart and mediastinal contours are within normal limits. No focal opacities or effusions. No acute bony abnormality. IMPRESSION: No active disease. Electronically Signed   By: Charlett Nose M.D.   On: 10/16/2019 00:11   DG Abd 2 Views  Result Date: 10/17/2019 CLINICAL DATA:  Abdominal pain and distension. Plain cholecystectomy tomorrow. History of emphysema. Former smoker. EXAM: ABDOMEN - 2 VIEW COMPARISON:  None. FINDINGS: Overall bowel gas pattern is nonobstructive. Moderate amount of gas throughout the small and large bowel. No evidence of soft tissue mass or abnormal fluid collection. No evidence of free intraperitoneal air. Lung bases are clear. Visualized osseous structures are unremarkable. IMPRESSION: Nonobstructive bowel gas pattern. Moderate amount of gas throughout the small and large bowel. Electronically Signed   By: Bary Richard M.D.   On: 10/17/2019 13:34   US Abdomen Limited RUQ  Result Date: 10/16/2019 CLINICAL DATA:  Acute cholecystitis, abdominal distension EXAM: ULTRASOUND ABDOMEN LIMITED RIGHT UPPER QUADRANT COMPARISON:  CT abdomen pelvis, 10/15/2019 FINDINGS: Gallbladder: The gallbladder is distended, maximum span 14.3 cm, and filled with echogenic sludge. No gallstones or wall thickening visualized. Positive sonographic Murphy sign noted by sonographer. Common bile duct: Diameter: 5 mm Liver: No focal  lesion identified. Increased parenchymal echogenicity. Portal vein is patent on color Doppler imaging with normal direction of blood flow towards the liver. Other: None. IMPRESSION: 1. The gallbladder is distended, maximum span 14.3 cm, and filled with echogenic sludge. No discrete calculi are identified. No gallbladder wall thickening. No biliary ductal dilatation. Positive sonographic Murphy sign. Findings are concerning for acute cholecystitis. Patency of the cystic and common bile ducts may be evaluated by HIDA if desired. 2.  Hepatic steatosis. Electronically Signed   By: Lauralyn Primes M.D.   On: 10/16/2019 09:29    Catarina Hartshorn, DO  [facility]  If 7PM-7AM, please contact night-coverage [email or web address] Password [email or web address] 10/17/2019, 3:04 PM   LOS: 1 day

## 2019-10-18 ENCOUNTER — Observation Stay (HOSPITAL_COMMUNITY): Payer: Medicare HMO | Admitting: Anesthesiology

## 2019-10-18 ENCOUNTER — Encounter (HOSPITAL_COMMUNITY): Payer: Self-pay | Admitting: Internal Medicine

## 2019-10-18 ENCOUNTER — Encounter (HOSPITAL_COMMUNITY)
Admission: EM | Disposition: A | Discharge status: Home / Self Care | Payer: Self-pay | Source: Home / Self Care | Attending: Emergency Medicine

## 2019-10-18 DIAGNOSIS — Z7951 Long term (current) use of inhaled steroids: Secondary | ICD-10-CM | POA: Diagnosis not present

## 2019-10-18 DIAGNOSIS — Z87891 Personal history of nicotine dependence: Secondary | ICD-10-CM | POA: Diagnosis not present

## 2019-10-18 DIAGNOSIS — K810 Acute cholecystitis: Secondary | ICD-10-CM | POA: Diagnosis not present

## 2019-10-18 DIAGNOSIS — J441 Chronic obstructive pulmonary disease with (acute) exacerbation: Secondary | ICD-10-CM | POA: Diagnosis not present

## 2019-10-18 DIAGNOSIS — R1011 Right upper quadrant pain: Secondary | ICD-10-CM | POA: Diagnosis not present

## 2019-10-18 DIAGNOSIS — K821 Hydrops of gallbladder: Secondary | ICD-10-CM | POA: Diagnosis not present

## 2019-10-18 DIAGNOSIS — Z20822 Contact with and (suspected) exposure to covid-19: Secondary | ICD-10-CM | POA: Diagnosis not present

## 2019-10-18 DIAGNOSIS — K8000 Calculus of gallbladder with acute cholecystitis without obstruction: Secondary | ICD-10-CM | POA: Diagnosis not present

## 2019-10-18 DIAGNOSIS — J449 Chronic obstructive pulmonary disease, unspecified: Secondary | ICD-10-CM | POA: Diagnosis not present

## 2019-10-18 DIAGNOSIS — K8010 Calculus of gallbladder with chronic cholecystitis without obstruction: Secondary | ICD-10-CM | POA: Diagnosis not present

## 2019-10-18 DIAGNOSIS — J439 Emphysema, unspecified: Secondary | ICD-10-CM | POA: Diagnosis not present

## 2019-10-18 DIAGNOSIS — K8012 Calculus of gallbladder with acute and chronic cholecystitis without obstruction: Secondary | ICD-10-CM | POA: Diagnosis not present

## 2019-10-18 HISTORY — PX: CHOLECYSTECTOMY: SHX55

## 2019-10-18 LAB — COMPREHENSIVE METABOLIC PANEL
ALT: 93 U/L — ABNORMAL HIGH (ref 0–44)
AST: 34 U/L (ref 15–41)
Albumin: 3 g/dL — ABNORMAL LOW (ref 3.5–5.0)
Alkaline Phosphatase: 127 U/L — ABNORMAL HIGH (ref 38–126)
Anion gap: 10 (ref 5–15)
BUN: 12 mg/dL (ref 8–23)
CO2: 23 mmol/L (ref 22–32)
Calcium: 8.6 mg/dL — ABNORMAL LOW (ref 8.9–10.3)
Chloride: 101 mmol/L (ref 98–111)
Creatinine, Ser: 1.1 mg/dL (ref 0.61–1.24)
GFR calc Af Amer: >60 mL/min (ref >60)
GFR calc non Af Amer: >60 mL/min (ref >60)
Glucose, Bld: 133 mg/dL — ABNORMAL HIGH (ref 70–99)
Potassium: 3.9 mmol/L (ref 3.5–5.1)
Sodium: 134 mmol/L — ABNORMAL LOW (ref 135–145)
Total Bilirubin: 0.6 mg/dL (ref 0.3–1.2)
Total Protein: 7.3 g/dL (ref 6.5–8.1)

## 2019-10-18 LAB — SURGICAL PCR SCREEN
MRSA, PCR: NEGATIVE
Staphylococcus aureus: NEGATIVE

## 2019-10-18 LAB — MAGNESIUM: Magnesium: 2 mg/dL (ref 1.7–2.4)

## 2019-10-18 SURGERY — LAPAROSCOPIC CHOLECYSTECTOMY
Anesthesia: General | Site: Abdomen

## 2019-10-18 MED ORDER — OXYCODONE HCL 5 MG PO TABS
5.0000 mg | ORAL_TABLET | ORAL | Status: DC | PRN
  Administered 2019-10-18: 5 mg via ORAL
  Administered 2019-10-19: 10 mg via ORAL
  Filled 2019-10-18: qty 1
  Filled 2019-10-18: qty 2

## 2019-10-18 MED ORDER — FENTANYL CITRATE (PF) 100 MCG/2ML IJ SOLN
25.0000 ug | INTRAMUSCULAR | Status: DC | PRN
  Administered 2019-10-18: 10:00:00 50 ug via INTRAVENOUS
  Filled 2019-10-18: qty 2

## 2019-10-18 MED ORDER — BUPIVACAINE LIPOSOME 1.3 % IJ SUSP
INTRAMUSCULAR | Status: AC
  Filled 2019-10-18: qty 20

## 2019-10-18 MED ORDER — ENOXAPARIN SODIUM 40 MG/0.4ML ~~LOC~~ SOLN
40.0000 mg | SUBCUTANEOUS | Status: DC
  Administered 2019-10-19: 09:00:00 40 mg via SUBCUTANEOUS
  Filled 2019-10-18: qty 0.4

## 2019-10-18 MED ORDER — SODIUM CHLORIDE 0.9 % IV SOLN: INTRAVENOUS | Status: DC

## 2019-10-18 MED ORDER — ONDANSETRON HCL 4 MG/2ML IJ SOLN
INTRAMUSCULAR | Status: DC | PRN
  Administered 2019-10-18: 4 mg via INTRAVENOUS

## 2019-10-18 MED ORDER — FENTANYL CITRATE (PF) 100 MCG/2ML IJ SOLN
INTRAMUSCULAR | Status: DC | PRN
  Administered 2019-10-18: 100 ug via INTRAVENOUS
  Administered 2019-10-18: 50 ug via INTRAVENOUS

## 2019-10-18 MED ORDER — SUCCINYLCHOLINE CHLORIDE 20 MG/ML IJ SOLN
INTRAMUSCULAR | Status: DC | PRN
  Administered 2019-10-18: 120 mg via INTRAVENOUS

## 2019-10-18 MED ORDER — LORAZEPAM 2 MG/ML IJ SOLN: 1.0000 mg | INTRAMUSCULAR | Status: DC | PRN

## 2019-10-18 MED ORDER — LACTATED RINGERS IV SOLN
INTRAVENOUS | Status: DC
  Administered 2019-10-18: 08:00:00 1000 mL via INTRAVENOUS

## 2019-10-18 MED ORDER — SUGAMMADEX SODIUM 500 MG/5ML IV SOLN
INTRAVENOUS | Status: DC | PRN
  Administered 2019-10-18: 200 mg via INTRAVENOUS

## 2019-10-18 MED ORDER — HEMOSTATIC AGENTS (NO CHARGE) OPTIME
TOPICAL | Status: DC | PRN
  Administered 2019-10-18 (×2): 1 via TOPICAL

## 2019-10-18 MED ORDER — LIDOCAINE HCL (CARDIAC) PF 50 MG/5ML IV SOSY
PREFILLED_SYRINGE | INTRAVENOUS | Status: DC | PRN
  Administered 2019-10-18: 100 mg via INTRAVENOUS

## 2019-10-18 MED ORDER — KETOROLAC TROMETHAMINE 15 MG/ML IJ SOLN
15.0000 mg | Freq: Four times a day (QID) | INTRAMUSCULAR | Status: AC
  Administered 2019-10-18: 11:00:00 15 mg via INTRAVENOUS
  Filled 2019-10-18: qty 1

## 2019-10-18 MED ORDER — FENTANYL CITRATE (PF) 250 MCG/5ML IJ SOLN
INTRAMUSCULAR | Status: AC
  Filled 2019-10-18: qty 5

## 2019-10-18 MED ORDER — KETOROLAC TROMETHAMINE 15 MG/ML IJ SOLN: 15.0000 mg | Freq: Four times a day (QID) | INTRAMUSCULAR | Status: DC | PRN

## 2019-10-18 MED ORDER — PHENYLEPHRINE 40 MCG/ML (10ML) SYRINGE FOR IV PUSH (FOR BLOOD PRESSURE SUPPORT)
PREFILLED_SYRINGE | INTRAVENOUS | Status: AC
  Filled 2019-10-18: qty 10

## 2019-10-18 MED ORDER — SODIUM CHLORIDE 0.9 % IR SOLN
Status: DC | PRN
  Administered 2019-10-18: 3000 mL
  Administered 2019-10-18: 1000 mL

## 2019-10-18 MED ORDER — SIMETHICONE 80 MG PO CHEW: 40.0000 mg | CHEWABLE_TABLET | Freq: Four times a day (QID) | ORAL | Status: DC | PRN

## 2019-10-18 MED ORDER — PHENYLEPHRINE HCL (PRESSORS) 10 MG/ML IV SOLN
INTRAVENOUS | Status: DC | PRN
Start: 2019-10-18 — End: 2019-10-18
  Administered 2019-10-18: 80 ug via INTRAVENOUS

## 2019-10-18 MED ORDER — PROPOFOL 10 MG/ML IV BOLUS: INTRAVENOUS | Status: DC | PRN

## 2019-10-18 MED ORDER — ROCURONIUM BROMIDE 100 MG/10ML IV SOLN
INTRAVENOUS | Status: DC | PRN
  Administered 2019-10-18: 10 mg via INTRAVENOUS
  Administered 2019-10-18: 30 mg via INTRAVENOUS

## 2019-10-18 MED ORDER — BUPIVACAINE LIPOSOME 1.3 % IJ SUSP
INTRAMUSCULAR | Status: DC | PRN
  Administered 2019-10-18: 20 mL

## 2019-10-18 MED ORDER — PROPOFOL 10 MG/ML IV BOLUS
INTRAVENOUS | Status: DC | PRN
  Administered 2019-10-18: 160 ug via INTRAVENOUS

## 2019-10-18 SURGICAL SUPPLY — 51 items
ADH SKN CLS APL DERMABOND .7 (GAUZE/BANDAGES/DRESSINGS) ×1
APL PRP STRL LF DISP 70% ISPRP (MISCELLANEOUS) ×1
APL SRG 38 LTWT LNG FL B (MISCELLANEOUS) ×1
APPLICATOR ARISTA FLEXITIP XL (MISCELLANEOUS) ×2 IMPLANT
APPLIER CLIP ROT 10 11.4 M/L (STAPLE) ×3
APR CLP MED LRG 11.4X10 (STAPLE) ×1
BAG RETRIEVAL 10 (BASKET) ×1
BAG RETRIEVAL 10MM (BASKET) ×1
CHLORAPREP W/TINT 26 (MISCELLANEOUS) ×3 IMPLANT
CLIP APPLIE ROT 10 11.4 M/L (STAPLE) ×1 IMPLANT
CLOTH BEACON ORANGE TIMEOUT ST (SAFETY) ×3 IMPLANT
COVER LIGHT HANDLE STERIS (MISCELLANEOUS) ×6 IMPLANT
COVER WAND RF STERILE (DRAPES) ×3 IMPLANT
DERMABOND ADVANCED (GAUZE/BANDAGES/DRESSINGS) ×2
DERMABOND ADVANCED .7 DNX12 (GAUZE/BANDAGES/DRESSINGS) ×1 IMPLANT
ELECT REM PT RETURN 9FT ADLT (ELECTROSURGICAL) ×3
ELECTRODE REM PT RTRN 9FT ADLT (ELECTROSURGICAL) ×1 IMPLANT
GLOVE BIO SURGEON STRL SZ7 (GLOVE) ×4 IMPLANT
GLOVE BIOGEL PI IND STRL 7.0 (GLOVE) ×2 IMPLANT
GLOVE BIOGEL PI INDICATOR 7.0 (GLOVE) ×4
GLOVE SURG SS PI 7.5 STRL IVOR (GLOVE) ×3 IMPLANT
GOWN STRL REUS W/TWL LRG LVL3 (GOWN DISPOSABLE) ×9 IMPLANT
HEMOSTAT ARISTA ABSORB 3G PWDR (HEMOSTASIS) ×2 IMPLANT
HEMOSTAT SNOW SURGICEL 2X4 (HEMOSTASIS) ×3 IMPLANT
INST SET LAPROSCOPIC AP (KITS) ×3 IMPLANT
IV NS IRRIG 3000ML ARTHROMATIC (IV SOLUTION) ×2 IMPLANT
KIT TURNOVER KIT A (KITS) ×3 IMPLANT
MANIFOLD NEPTUNE II (INSTRUMENTS) ×3 IMPLANT
NDL HYPO 18GX1.5 BLUNT FILL (NEEDLE) ×1 IMPLANT
NDL INSUFFLATION 14GA 120MM (NEEDLE) ×1 IMPLANT
NEEDLE HYPO 18GX1.5 BLUNT FILL (NEEDLE) ×3 IMPLANT
NEEDLE HYPO 22GX1.5 SAFETY (NEEDLE) ×3 IMPLANT
NEEDLE INSUFFLATION 14GA 120MM (NEEDLE) ×3 IMPLANT
NS IRRIG 1000ML POUR BTL (IV SOLUTION) ×3 IMPLANT
PACK LAP CHOLE LZT030E (CUSTOM PROCEDURE TRAY) ×3 IMPLANT
PAD ARMBOARD 7.5X6 YLW CONV (MISCELLANEOUS) ×3 IMPLANT
SET BASIN LINEN APH (SET/KITS/TRAYS/PACK) ×3 IMPLANT
SET TUBE IRRIG SUCTION NO TIP (IRRIGATION / IRRIGATOR) ×2 IMPLANT
SET TUBE SMOKE EVAC HIGH FLOW (TUBING) ×3 IMPLANT
SLEEVE ENDOPATH XCEL 5M (ENDOMECHANICALS) ×3 IMPLANT
SUT MNCRL AB 4-0 PS2 18 (SUTURE) ×8 IMPLANT
SUT VICRYL 0 UR6 27IN ABS (SUTURE) ×5 IMPLANT
SYR 20ML LL LF (SYRINGE) ×6 IMPLANT
SYS BAG RETRIEVAL 10MM (BASKET) ×1
SYSTEM BAG RETRIEVAL 10MM (BASKET) ×1 IMPLANT
TROCAR ENDO BLADELESS 11MM (ENDOMECHANICALS) ×3 IMPLANT
TROCAR XCEL NON-BLD 5MMX100MML (ENDOMECHANICALS) ×3 IMPLANT
TROCAR XCEL UNIV SLVE 11M 100M (ENDOMECHANICALS) ×3 IMPLANT
TUBE CONNECTING 12'X1/4 (SUCTIONS) ×1
TUBE CONNECTING 12X1/4 (SUCTIONS) ×2 IMPLANT
WARMER LAPAROSCOPE (MISCELLANEOUS) ×3 IMPLANT

## 2019-10-18 NOTE — Transfer of Care (Signed)
Immediate Anesthesia Transfer of Care Note  Patient: Gordon Navarro  Procedure(s) Performed: LAPAROSCOPIC CHOLECYSTECTOMY (N/A Abdomen)  Patient Location: PACU  Anesthesia Type:General  Level of Consciousness: awake and patient cooperative  Airway & Oxygen Therapy: Patient Spontanous Breathing and non-rebreather face mask  Post-op Assessment: Report given to RN and Post -op Vital signs reviewed and stable  Post vital signs: Reviewed and stable  Last Vitals:  Vitals Value Taken Time  BP 166/91 10/18/19 0955  Temp 98.9   Pulse 92 10/18/19 0959  Resp 22 10/18/19 0959  SpO2 96 % 10/18/19 0959  Vitals shown include unvalidated device data.  Last Pain:  Vitals:   10/18/19 0804  TempSrc: Oral  PainSc: 3       Patients Stated Pain Goal: 8 (10/18/19 0804)  Complications: No apparent anesthesia complications

## 2019-10-18 NOTE — Op Note (Signed)
Patient:  Gordon Navarro  DOB:  04-19-1951  MRN:  [id number]   Preop Diagnosis: Cholecystitis, cholelithiasis  Postop Diagnosis: Same, hydrops of gallbladder  Procedure: Laparoscopic cholecystectomy  Surgeon: Franky Macho, MD  Anes: General endotracheal  Indications: Patient is a 69 year old white male who presents with acute cholecystitis secondary to cholelithiasis.  The risks and benefits of the procedure including bleeding, infection, hepatobiliary injury, and the possibility of an open procedure were fully explained to the patient, who gave informed consent.  Procedure note: The patient was placed in the supine position.  After induction of general endotracheal anesthesia, the abdomen was prepped and draped using the usual sterile technique with ChloraPrep.  Surgical site confirmation was performed.  A supraumbilical incision was made made down to the fascia.  A Veress needle was introduced into the abdominal cavity and confirmation of placement was done using the saline drop test.  The abdomen was then insufflated to 15 mmHg pressure.  An 11 mm trocar was introduced into the abdominal cavity under direct visualization without difficulty.  The patient was placed in reverse Trendelenburg position and an additional 1 mm trocar was placed in the epigastric region and 5 mm trochars were placed the right upper quadrant and right flank regions.  The gallbladder was noted to be distended and tense.  Surrounding omentum was freed away bluntly from the gallbladder wall.  The gallbladder was then decompressed in order to facilitate exposure.  Hydrops of the gallbladder was found.  The gallbladder was then retracted in a dynamic fashion in order to provide a critical view of the triangle of [place].  The cystic duct was first identified.  Its juncture to the infundibulum was fully identified.  Endoclips were placed proximally distally on the cystic duct, and the cystic duct was divided.  This was likewise  done to the cystic artery.  The gallbladder was freed away from the gallbladder fossa using Bovie electrocautery.  The gallbladder was delivered through the epigastric trocar site using an Endo Catch bag.  The gallbladder fossa was inspected and no abnormal bleeding or bile leakage was noted.  Surgicel and Arista were placed in the gallbladder fossa.  All fluid and air were then evacuated from the abdominal cavity prior to removal of the trochars.  All wounds were irrigated with normal saline.  All wounds were injected with Exparel.  The supraumbilical fascia as well as epigastric fascia were reapproximated using 0 Vicryl interrupted sutures.  All skin incisions were closed using a 4-0 Monocryl subcuticular suture.  Dermabond was applied.  All tape and needle counts were correct at the end of the procedure.  The patient was extubated in the operating room and transferred to PACU in stable condition.    Complications: None  EBL: Minimal  Specimen: Gallbladder

## 2019-10-18 NOTE — Anesthesia Preprocedure Evaluation (Signed)
Anesthesia Evaluation  Patient identified by MRN, date of birth, ID band Patient awake    Reviewed: Allergy & Precautions, H&P , NPO status , Patient's Chart, lab work & pertinent test results, reviewed documented beta blocker date and time   Airway Mallampati: II  TM Distance: >3 FB Neck ROM: full    Dental no notable dental hx. (+) Missing, Poor Dentition   Pulmonary COPD,  COPD inhaler, former smoker,    Pulmonary exam normal breath sounds clear to auscultation       Cardiovascular Exercise Tolerance: Good negative cardio ROS   Rhythm:regular Rate:Normal     Neuro/Psych negative neurological ROS  negative psych ROS   GI/Hepatic negative GI ROS, Neg liver ROS,   Endo/Other  negative endocrine ROS  Renal/GU negative Renal ROS  negative genitourinary   Musculoskeletal   Abdominal   Peds  Hematology negative hematology ROS (+)   Anesthesia Other Findings   Reproductive/Obstetrics negative OB ROS                             Anesthesia Physical Anesthesia Plan  ASA: III  Anesthesia Plan: General   Post-op Pain Management:    Induction:   PONV Risk Score and Plan: 2 and Ondansetron  Airway Management Planned:   Additional Equipment:   Intra-op Plan:   Post-operative Plan:   Informed Consent: I have reviewed the patients History and Physical, chart, labs and discussed the procedure including the risks, benefits and alternatives for the proposed anesthesia with the patient or authorized representative who has indicated his/her understanding and acceptance.     Dental Advisory Given  Plan Discussed with: CRNA  Anesthesia Plan Comments:         Anesthesia Quick Evaluation

## 2019-10-18 NOTE — Anesthesia Postprocedure Evaluation (Signed)
Anesthesia Post Note  Patient: Gordon Navarro  Procedure(s) Performed: LAPAROSCOPIC CHOLECYSTECTOMY (N/A Abdomen)  Patient location during evaluation: PACU Anesthesia Type: General Level of consciousness: awake and alert and patient cooperative Pain management: satisfactory to patient Vital Signs Assessment: post-procedure vital signs reviewed and stable Respiratory status: spontaneous breathing and patient connected to nasal cannula oxygen Cardiovascular status: stable Postop Assessment: no apparent nausea or vomiting Anesthetic complications: no     Last Vitals:  Vitals:   10/18/19 1015 10/18/19 1030  BP: (!) 146/79 136/75  Pulse: 89 88  Resp:  18  Temp:    SpO2: 100% 100%    Last Pain:  Vitals:   10/18/19 1030  TempSrc:   PainSc: 6                  Lanette Ell

## 2019-10-18 NOTE — Anesthesia Procedure Notes (Signed)
Procedure Name: Intubation Date/Time: 10/18/2019 8:50 AM Performed by: Vista Deck, CRNA Pre-anesthesia Checklist: Patient identified, Patient being monitored, Timeout performed, Emergency Drugs available and Suction available Patient Re-evaluated:Patient Re-evaluated prior to induction Oxygen Delivery Method: Circle System Utilized Preoxygenation: Pre-oxygenation with 100% oxygen Induction Type: IV induction Laryngoscope Size: Mac and 3 Grade View: Grade I Tube type: Oral Tube size: 7.0 mm Number of attempts: 1 Airway Equipment and Method: stylet Placement Confirmation: ETT inserted through vocal cords under direct vision,  positive ETCO2 and breath sounds checked- equal and bilateral Secured at: 21 cm Tube secured with: Tape Dental Injury: Teeth and Oropharynx as per pre-operative assessment

## 2019-10-18 NOTE — Progress Notes (Signed)
PROGRESS NOTE  Gordon Navarro ZTI:[id number] DOB: [birth year] DOA: 10/15/2019 PCP: Mechele Claude, MD  Brief History:  69 year old male with a history of COPD and no other documented chronic medical problems presenting with right upper quadrant abdominal pain with associated nausea and vomiting that began on 10/11/2019.  The patient has had subjective fevers and chills.  He denies any hematemesis, diarrhea, hematochezia, melena, dysuria, hematuria.  Because of worsening pain, he presented for further evaluation.  In the emergency department, the patient had a low-grade temperature of 99.7 F.  He was hemodynamically stable without hypoxia.  Lipase was 18 with normal LFTs initially.  CT of the abdomen and pelvis showed gallbladder distention.  Right upper quadrant ultrasound showed gallbladder filled with sludge and distended.  General surgery was consulted to assist with management.  Assessment/Plan: Acute cholecystitis -General surgery consult -Case discussed with general surgery, Dr. Lovell Sheehan -disContinue Zosyn -Continue IV fluids -10/18/19--lap chole -UA without pyuria -Judicious pain control -CT abdomen and right upper quadrant ultrasound as discussed above  COPD Exacerbation -c/o more sob 4/22 -start IV solumedrol -Continue Pulmicort -continue duo nebs -Stable on room air--92-93%  Hypomagnesemia -Replete       Disposition Plan: Patient From: Home D/C Place: Home Barriers: Not Clinically Stable--continues on IV steroids  Family Communication:   Family at bedside  Consultants:  General surgery  Code Status:  FULL   DVT Prophylaxis:  [place] Heparin   Procedures: As Listed in Progress Note Above  Antibiotics: Zosyn 4/20>>>4/23    Subjective: Pt complains abd pain.  Patient denies fevers, chills, headache, chest pain,  nausea, vomiting, diarrhea, abdominal pain, dysuria, hematuria, hematochezia, and melena.   Objective: Vitals:   10/18/19  1030 10/18/19 1100 10/18/19 1124 10/18/19 1449  BP: 136/75 (!) 142/73 130/71   Pulse: 88 89 92   Resp: 18 (!) 24 (!) 24   Temp:   99 F (37.2 C)   TempSrc:      SpO2: 100% 97% 94% 94%  Weight:      Height:        Intake/Output Summary (Last 24 hours) at 10/18/2019 1803 Last data filed at 10/18/2019 1500 Gross per 24 hour  Intake 1632.88 ml  Output 10 ml  Net 1622.88 ml   Weight change:  Exam:   General:  Pt is alert, follows commands appropriately, not in acute distress  HEENT: No icterus, No thrush, No neck mass, [place]/AT  Cardiovascular: RRR, S1/S2, no rubs, no gallops  Respiratory: bibasilar rales.  Bibasilar wheeze  Abdomen: Soft/+BS, non tender, non distended, no guarding  Extremities: No edema, No lymphangitis, No petechiae, No rashes, no synovitis   Data Reviewed: I have personally reviewed following labs and imaging studies Basic Metabolic Panel: Recent Labs  Lab 10/15/19 2258 10/16/19 0818 10/17/19 0552 10/18/19 0622  NA 135 134* 133* 134*  K 4.0 4.4 4.4 3.9  CL 97* 97* 97* 101  CO2 27 27 27 23   GLUCOSE 132* 126* 118* 133*  BUN 10 14 11 12   CREATININE 1.14 1.20 1.19 1.10  CALCIUM 8.8* 8.2* 8.3* 8.6*  MG 1.6*  --   --  2.0   Liver Function Tests: Recent Labs  Lab 10/15/19 2258 10/16/19 0818 10/17/19 0552 10/18/19 0622  AST 25 322* 96* 34  ALT 39 219* 154* 93*  ALKPHOS 103 137* 144* 127*  BILITOT 0.9 1.8* 1.2 0.6  PROT 8.2* 6.5 7.0 7.3  ALBUMIN 3.9 3.1* 3.1* 3.0*  Recent Labs  Lab 10/15/19 2258  LIPASE 18  17   No results for input(s): AMMONIA in the last 168 hours. Coagulation Profile: No results for input(s): INR, PROTIME in the last 168 hours. CBC: Recent Labs  Lab 10/15/19 2258 10/16/19 0818 10/17/19 0552  WBC 11.6* 7.6 9.9  NEUTROABS  --  5.3  --   HGB 14.2 12.9* 12.2*  HCT 45.4 41.2 39.4  MCV 91.2 90.5 92.5  PLT 218 192 199   Cardiac Enzymes: No results for input(s): CKTOTAL, CKMB, CKMBINDEX, TROPONINI in the last  168 hours. BNP: Invalid input(s): POCBNP CBG: No results for input(s): GLUCAP in the last 168 hours. HbA1C: No results for input(s): HGBA1C in the last 72 hours. Urine analysis:    Component Value Date/Time   COLORURINE YELLOW 10/15/2019 2240   APPEARANCEUR CLEAR 10/15/2019 2240   APPEARANCEUR Clear 08/28/2019 1157   LABSPEC 1.021 10/15/2019 2240   PHURINE 6.0 10/15/2019 2240   GLUCOSEU NEGATIVE 10/15/2019 2240   HGBUR NEGATIVE 10/15/2019 2240   BILIRUBINUR NEGATIVE 10/15/2019 2240   BILIRUBINUR Negative 08/28/2019 1157   KETONESUR 5 (A) 10/15/2019 2240   PROTEINUR 30 (A) 10/15/2019 2240   NITRITE NEGATIVE 10/15/2019 2240   LEUKOCYTESUR NEGATIVE 10/15/2019 2240   Sepsis Labs: @LABRCNTIP (procalcitonin:4,lacticidven:4) ) Recent Results (from the past 240 hour(s))  SARS CORONAVIRUS 2 (Elira Colasanti 6-24 HRS) Nasopharyngeal Nasopharyngeal Swab     Status: None   Collection Time: 10/16/19  1:59 AM   Specimen: Nasopharyngeal Swab  Result Value Ref Range Status   SARS Coronavirus 2 NEGATIVE NEGATIVE Final    Comment: (NOTE) SARS-CoV-2 target nucleic acids are NOT DETECTED. The SARS-CoV-2 RNA is generally detectable in upper and lower respiratory specimens during the acute phase of infection. Negative results do not preclude SARS-CoV-2 infection, do not rule out co-infections with other pathogens, and should not be used as the sole basis for treatment or other patient management decisions. Negative results must be combined with clinical observations, patient history, and epidemiological information. The expected result is Negative. Fact Sheet for Patients: SugarRoll.be Fact Sheet for Healthcare Providers: https://www.woods-mathews.com/ This test is not yet approved or cleared by the [place] FDA and  has been authorized for detection and/or diagnosis of SARS-CoV-2 by FDA under an Emergency Use Authorization (EUA). This EUA will remain  in  effect (meaning this test can be used) for the duration of the COVID-19 declaration under Section 56 4(b)(1) of the Act, 21 U.S.C. section 360bbb-3(b)(1), unless the authorization is terminated or revoked sooner. Performed at [facility], [place] [address] [place], [place] [address]   Surgical PCR screen     Status: None   Collection Time: 10/17/19 11:59 PM   Specimen: Nasal Mucosa; Nasal Swab  Result Value Ref Range Status   MRSA, PCR NEGATIVE NEGATIVE Final   Staphylococcus aureus NEGATIVE NEGATIVE Final    Comment: (NOTE) The Xpert SA Assay (FDA approved for NASAL specimens in patients 73 years of age and older), is one component of a comprehensive surveillance program. It is not intended to diagnose infection nor to guide or monitor treatment. Performed at [facility], [address] [place], [place] [address]      Scheduled Meds: . budesonide (PULMICORT) nebulizer solution  0.5 mg Nebulization BID  . [START ON 10/19/2019] enoxaparin (LOVENOX) injection  40 mg Subcutaneous Q24H  . ipratropium-albuterol  3 mL Nebulization Q6H  . methylPREDNISolone (SOLU-MEDROL) injection  60 mg Intravenous Daily  . pantoprazole (PROTONIX) IV  40 mg Intravenous  Daily   Continuous Infusions: . sodium chloride 75 mL/hr at 10/18/19 0013  . sodium chloride 75 mL/hr at 10/18/19 1348  . piperacillin-tazobactam 3.375 g (10/18/19 1442)    Procedures/Studies: CT Abdomen Pelvis W Contrast  Result Date: 10/16/2019 CLINICAL DATA:  Abdominal distension EXAM: CT ABDOMEN AND PELVIS WITH CONTRAST TECHNIQUE: Multidetector CT imaging of the abdomen and pelvis was performed using the standard protocol following bolus administration of intravenous contrast. CONTRAST:  OMNIPAQUE IOHEXOL 300 MG/ML  SOLN COMPARISON:  None. FINDINGS: Lower chest: Lung bases are clear. No effusions. Heart is normal size. Hepatobiliary: Gallbladder is distended. No focal hepatic abnormality. No biliary ductal dilatation.  Pancreas: No focal abnormality or ductal dilatation. Spleen: No focal abnormality.  Normal size. Adrenals/Urinary Tract: No adrenal abnormality. No focal renal abnormality. No stones or hydronephrosis. Urinary bladder is unremarkable. Stomach/Bowel: Stomach, large and small bowel grossly unremarkable. Normal appendix. Vascular/Lymphatic: Calcified and noncalcified irregular plaque throughout the aorta. No aneurysm or adenopathy. Reproductive: No visible focal abnormality. Other: No free fluid or free air. Musculoskeletal: No acute bony abnormality. IMPRESSION: Distention of the gallbladder. If there is clinical concern for cholecystitis, this could be further evaluated with right upper quadrant ultrasound. Aortic atherosclerosis. Electronically Signed   By: Charlett Nose M.D.   On: 10/16/2019 00:10   DG Chest Port 1 View  Result Date: 10/16/2019 CLINICAL DATA:  Cough EXAM: PORTABLE CHEST 1 VIEW COMPARISON:  12/26/2018 FINDINGS: Heart and mediastinal contours are within normal limits. No focal opacities or effusions. No acute bony abnormality. IMPRESSION: No active disease. Electronically Signed   By: Charlett Nose M.D.   On: 10/16/2019 00:11   DG Abd 2 Views  Result Date: 10/17/2019 CLINICAL DATA:  Abdominal pain and distension. Plain cholecystectomy tomorrow. History of emphysema. Former smoker. EXAM: ABDOMEN - 2 VIEW COMPARISON:  None. FINDINGS: Overall bowel gas pattern is nonobstructive. Moderate amount of gas throughout the small and large bowel. No evidence of soft tissue mass or abnormal fluid collection. No evidence of free intraperitoneal air. Lung bases are clear. Visualized osseous structures are unremarkable. IMPRESSION: Nonobstructive bowel gas pattern. Moderate amount of gas throughout the small and large bowel. Electronically Signed   By: Bary Richard M.D.   On: 10/17/2019 13:34   US Abdomen Limited RUQ  Result Date: 10/16/2019 CLINICAL DATA:  Acute cholecystitis, abdominal distension EXAM:  ULTRASOUND ABDOMEN LIMITED RIGHT UPPER QUADRANT COMPARISON:  CT abdomen pelvis, 10/15/2019 FINDINGS: Gallbladder: The gallbladder is distended, maximum span 14.3 cm, and filled with echogenic sludge. No gallstones or wall thickening visualized. Positive sonographic Murphy sign noted by sonographer. Common bile duct: Diameter: 5 mm Liver: No focal lesion identified. Increased parenchymal echogenicity. Portal vein is patent on color Doppler imaging with normal direction of blood flow towards the liver. Other: None. IMPRESSION: 1. The gallbladder is distended, maximum span 14.3 cm, and filled with echogenic sludge. No discrete calculi are identified. No gallbladder wall thickening. No biliary ductal dilatation. Positive sonographic Murphy sign. Findings are concerning for acute cholecystitis. Patency of the cystic and common bile ducts may be evaluated by HIDA if desired. 2.  Hepatic steatosis. Electronically Signed   By: Lauralyn Primes M.D.   On: 10/16/2019 09:29    Catarina Hartshorn, DO  [facility]  If 7PM-7AM, please contact night-coverage [email or web address] Password [email or web address] 10/18/2019, 6:03 PM   LOS: 1 day

## 2019-10-18 NOTE — Interval H&P Note (Signed)
History and Physical Interval Note:  10/18/2019 8:13 AM  Gordon Navarro  has presented today for surgery, with the diagnosis of cholecystitis, cholelithiasis.  The various methods of treatment have been discussed with the patient and family. After consideration of risks, benefits and other options for treatment, the patient has consented to  Procedure(s): LAPAROSCOPIC CHOLECYSTECTOMY (N/A) as a surgical intervention.  The patient's history has been reviewed, patient examined, no change in status, stable for surgery.  I have reviewed the patient's chart and labs.  Questions were answered to the patient's satisfaction.     Franky Macho

## 2019-10-19 DIAGNOSIS — K810 Acute cholecystitis: Secondary | ICD-10-CM | POA: Diagnosis not present

## 2019-10-19 DIAGNOSIS — J441 Chronic obstructive pulmonary disease with (acute) exacerbation: Secondary | ICD-10-CM | POA: Diagnosis not present

## 2019-10-19 DIAGNOSIS — K8012 Calculus of gallbladder with acute and chronic cholecystitis without obstruction: Secondary | ICD-10-CM | POA: Diagnosis not present

## 2019-10-19 DIAGNOSIS — J449 Chronic obstructive pulmonary disease, unspecified: Secondary | ICD-10-CM | POA: Diagnosis not present

## 2019-10-19 LAB — COMPREHENSIVE METABOLIC PANEL
ALT: 93 U/L — ABNORMAL HIGH (ref 0–44)
AST: 71 U/L — ABNORMAL HIGH (ref 15–41)
Albumin: 2.5 g/dL — ABNORMAL LOW (ref 3.5–5.0)
Alkaline Phosphatase: 110 U/L (ref 38–126)
Anion gap: 9 (ref 5–15)
BUN: 15 mg/dL (ref 8–23)
CO2: 25 mmol/L (ref 22–32)
Calcium: 7.8 mg/dL — ABNORMAL LOW (ref 8.9–10.3)
Chloride: 104 mmol/L (ref 98–111)
Creatinine, Ser: 1.16 mg/dL (ref 0.61–1.24)
GFR calc Af Amer: >60 mL/min (ref >60)
GFR calc non Af Amer: >60 mL/min (ref >60)
Glucose, Bld: 130 mg/dL — ABNORMAL HIGH (ref 70–99)
Potassium: 3.6 mmol/L (ref 3.5–5.1)
Sodium: 138 mmol/L (ref 135–145)
Total Bilirubin: 0.5 mg/dL (ref 0.3–1.2)
Total Protein: 6.3 g/dL — ABNORMAL LOW (ref 6.5–8.1)

## 2019-10-19 LAB — CBC
HCT: 37 % — ABNORMAL LOW (ref 39.0–52.0)
Hemoglobin: 11.4 g/dL — ABNORMAL LOW (ref 13.0–17.0)
MCH: 28 pg (ref 26.0–34.0)
MCHC: 30.8 g/dL (ref 30.0–36.0)
MCV: 90.9 fL (ref 80.0–100.0)
Platelets: 230 10*3/uL (ref 150–400)
RBC: 4.07 MIL/uL — ABNORMAL LOW (ref 4.22–5.81)
RDW: 14.6 % (ref 11.5–15.5)
WBC: 8.6 10*3/uL (ref 4.0–10.5)
nRBC: 0 % (ref 0.0–0.2)

## 2019-10-19 LAB — MAGNESIUM: Magnesium: 2 mg/dL (ref 1.7–2.4)

## 2019-10-19 LAB — PHOSPHORUS: Phosphorus: 2.1 mg/dL — ABNORMAL LOW (ref 2.5–4.6)

## 2019-10-19 MED ORDER — K PHOS MONO-SOD PHOS DI & MONO 155-852-130 MG PO TABS
500.0000 mg | ORAL_TABLET | Freq: Once | ORAL | Status: AC
  Administered 2019-10-19: 12:00:00 500 mg via ORAL
  Filled 2019-10-19: qty 2

## 2019-10-19 MED ORDER — PREDNISONE 50 MG PO TABS: 50.0000 mg | ORAL_TABLET | Freq: Every day | ORAL | 0 refills | Status: DC

## 2019-10-19 MED ORDER — OXYCODONE HCL 5 MG PO TABS: 5.0000 mg | ORAL_TABLET | ORAL | 0 refills | Status: AC | PRN

## 2019-10-19 NOTE — Discharge Instructions (Signed)
Laparoscopic Cholecystectomy, Care After This sheet gives you information about how to care for yourself after your procedure. Your doctor may also give you more specific instructions. If you have problems or questions, contact your doctor. Follow these instructions at home: Care for cuts from surgery (incisions)   Follow instructions from your doctor about how to take care of your cuts from surgery. Make sure you: ? Wash your hands with soap and water before you change your bandage (dressing). If you cannot use soap and water, use hand sanitizer. ? Change your bandage as told by your doctor. ? Leave stitches (sutures), skin glue, or skin tape (adhesive) strips in place. They may need to stay in place for 2 weeks or longer. If tape strips get loose and curl up, you may trim the loose edges. Do not remove tape strips completely unless your doctor says it is okay.  Do not take baths, swim, or use a hot tub until your doctor says it is okay. Ask your doctor if you can take showers. You may only be allowed to take sponge baths for bathing.  Check your surgical cut area every day for signs of infection. Check for: ? More redness, swelling, or pain. ? More fluid or blood. ? Warmth. ? Pus or a bad smell. Activity  Do not drive or use heavy machinery while taking prescription pain medicine.  Do not lift anything that is heavier than 10 lb (4.5 kg) until your doctor says it is okay.  Do not play contact sports until your doctor says it is okay.  Do not drive for 24 hours if you were given a medicine to help you relax (sedative).  Rest as needed. Do not return to work or school until your doctor says it is okay. General instructions  Take over-the-counter and prescription medicines only as told by your doctor.  To prevent or treat constipation while you are taking prescription pain medicine, your doctor may recommend that you: ? Drink enough fluid to keep your pee (urine) clear or pale  yellow. ? Take over-the-counter or prescription medicines. ? Eat foods that are high in fiber, such as fresh fruits and vegetables, whole grains, and beans. ? Limit foods that are high in fat and processed sugars, such as fried and sweet foods. Contact a doctor if:  You develop a rash.  You have more redness, swelling, or pain around your surgical cuts.  You have more fluid or blood coming from your surgical cuts.  Your surgical cuts feel warm to the touch.  You have pus or a bad smell coming from your surgical cuts.  You have a fever.  One or more of your surgical cuts breaks open. Get help right away if:  You have trouble breathing.  You have chest pain.  You have pain that is getting worse in your shoulders.  You faint or feel dizzy when you stand.  You have very bad pain in your belly (abdomen).  You are sick to your stomach (nauseous) for more than one day.  You have throwing up (vomiting) that lasts for more than one day.  You have leg pain. This information is not intended to replace advice given to you by your health care provider. Make sure you discuss any questions you have with your health care provider. Document Revised: 05/26/2017 Document Reviewed: 11/30/2015 Elsevier Patient Education  2020 [employer]  

## 2019-10-19 NOTE — Discharge Summary (Signed)
Physician Discharge Summary  Gordon Navarro IZT:[id number] DOB: February 04, 1951 DOA: 10/15/2019  PCP: Mechele Claude, MD  Admit date: 10/15/2019 Discharge date: 10/19/2019  Admitted From: Home Disposition:  Home  Recommendations for Outpatient Follow-up:  1. Follow up with PCP in 1-2 weeks 2. Please obtain BMP/CBC in one week   Home Health: YES Equipment/Devices: 2L  Discharge Condition: Stable CODE STATUS: FULL Diet recommendation: Heart Healthy    Brief/Interim Summary: 69 year old male with a history of COPD and no other documented chronic medical problems presenting with right upper quadrant abdominal pain with associated nausea and vomiting that began on 10/11/2019. The patient has had subjective fevers and chills. He denies any hematemesis, diarrhea, hematochezia, melena, dysuria, hematuria. Because of worsening pain, he presented for further evaluation. In the emergency department, the patient had a low-grade temperature of 99.7 F. He was hemodynamically stable without hypoxia. Lipase was 18 with normal LFTs initially. CT of the abdomen and pelvis showed gallbladder distention. Right upper quadrant ultrasound showed gallbladder filled with sludge and distended. General surgery was consulted to assist with management.  He was noted to have increasing sob due to COPD exacerbation.  He was started on steroids and bronchodilators with improvement. After surgery, his diet was advanced which he tolerated  Discharge Diagnoses:  Acute cholecystitis -General surgery consulted -Case discussed with general surgery, Dr. Lovell Sheehan -disContinue Zosyn -Continue IV fluids -10/18/19--lap chole -UA without pyuria -Judicious pain control -CT abdomen and right upper quadrant ultrasound as discussed above -diet advanced post surgery which the patient tolerated  COPDExacerbation -c/o more sob 4/22 -start IV solumedrol>>d/c home with prednisone 50 mg daily x 3 more  days -ContinuedPulmicort -continueduo nebs -Stable on room air--92-93% -d/c home with 2L [place] as he desaturated with ambulation on day of d/c  Hypomagnesemia -Repleted   Discharge Instructions  Discharge Instructions    Diet - low sodium heart healthy   Complete by: As directed    Increase activity slowly   Complete by: As directed      Allergies as of 10/19/2019   No Known Allergies     Medication List    TAKE these medications   Breo Ellipta 100-25 MCG/INH Aepb Generic drug: fluticasone furoate-vilanterol Inhale 1 puff into the lungs daily.   ibuprofen 200 MG tablet Commonly known as: ADVIL Take 200-400 mg by mouth every 8 (eight) hours as needed (headache).   oxyCODONE 5 MG immediate release tablet Commonly known as: Roxicodone Take 1 tablet (5 mg total) by mouth every 4 (four) hours as needed.   Vitamin D (Ergocalciferol) 1.25 MG (50000 UNIT) Caps capsule Commonly known as: DRISDOL Take 1 capsule (50,000 Units total) by mouth every 7 (seven) days.            Durable Medical Equipment  (From admission, onward)         Start     Ordered   10/19/19 1018  For home use only DME oxygen  Once    Question Answer Comment  Length of Need 6 Months   Mode or (Route) Nasal cannula   Liters per Minute 2   Frequency Continuous (stationary and portable oxygen unit needed)   Oxygen conserving device Yes   Oxygen delivery system Gas      10/19/19 1017         Follow-up Information    Franky Macho, MD Follow up.   Specialty: General Surgery Why: As needed.  Will call you in 1-2 weeks for follow up Contact information: [address] [place]  [place] [address] [phone]          No Known Allergies  Consultations:  General surgery   Procedures/Studies: CT Abdomen Pelvis W Contrast  Result Date: 10/16/2019 CLINICAL DATA:  Abdominal distension EXAM: CT ABDOMEN AND PELVIS WITH CONTRAST TECHNIQUE: Multidetector CT imaging of the abdomen and  pelvis was performed using the standard protocol following bolus administration of intravenous contrast. CONTRAST:  19mL OMNIPAQUE IOHEXOL 300 MG/ML  SOLN COMPARISON:  None. FINDINGS: Lower chest: Lung bases are clear. No effusions. Heart is normal size. Hepatobiliary: Gallbladder is distended. No focal hepatic abnormality. No biliary ductal dilatation. Pancreas: No focal abnormality or ductal dilatation. Spleen: No focal abnormality.  Normal size. Adrenals/Urinary Tract: No adrenal abnormality. No focal renal abnormality. No stones or hydronephrosis. Urinary bladder is unremarkable. Stomach/Bowel: Stomach, large and small bowel grossly unremarkable. Normal appendix. Vascular/Lymphatic: Calcified and noncalcified irregular plaque throughout the aorta. No aneurysm or adenopathy. Reproductive: No visible focal abnormality. Other: No free fluid or free air. Musculoskeletal: No acute bony abnormality. IMPRESSION: Distention of the gallbladder. If there is clinical concern for cholecystitis, this could be further evaluated with right upper quadrant ultrasound. Aortic atherosclerosis. Electronically Signed   By: Rolm Baptise M.D.   On: 10/16/2019 00:10   DG Chest Port 1 View  Result Date: 10/16/2019 CLINICAL DATA:  Cough EXAM: PORTABLE CHEST 1 VIEW COMPARISON:  12/26/2018 FINDINGS: Heart and mediastinal contours are within normal limits. No focal opacities or effusions. No acute bony abnormality. IMPRESSION: No active disease. Electronically Signed   By: Rolm Baptise M.D.   On: 10/16/2019 00:11   DG Abd 2 Views  Result Date: 10/17/2019 CLINICAL DATA:  Abdominal pain and distension. Plain cholecystectomy tomorrow. History of emphysema. Former smoker. EXAM: ABDOMEN - 2 VIEW COMPARISON:  None. FINDINGS: Overall bowel gas pattern is nonobstructive. Moderate amount of gas throughout the small and large bowel. No evidence of soft tissue mass or abnormal fluid collection. No evidence of free intraperitoneal air. Lung  bases are clear. Visualized osseous structures are unremarkable. IMPRESSION: Nonobstructive bowel gas pattern. Moderate amount of gas throughout the small and large bowel. Electronically Signed   By: Franki Cabot M.D.   On: 10/17/2019 13:34   US Abdomen Limited RUQ  Result Date: 10/16/2019 CLINICAL DATA:  Acute cholecystitis, abdominal distension EXAM: ULTRASOUND ABDOMEN LIMITED RIGHT UPPER QUADRANT COMPARISON:  CT abdomen pelvis, 10/15/2019 FINDINGS: Gallbladder: The gallbladder is distended, maximum span 14.3 cm, and filled with echogenic sludge. No gallstones or wall thickening visualized. Positive sonographic Murphy sign noted by sonographer. Common bile duct: Diameter: 5 mm Liver: No focal lesion identified. Increased parenchymal echogenicity. Portal vein is patent on color Doppler imaging with normal direction of blood flow towards the liver. Other: None. IMPRESSION: 1. The gallbladder is distended, maximum span 14.3 cm, and filled with echogenic sludge. No discrete calculi are identified. No gallbladder wall thickening. No biliary ductal dilatation. Positive sonographic Murphy sign. Findings are concerning for acute cholecystitis. Patency of the cystic and common bile ducts may be evaluated by HIDA if desired. 2.  Hepatic steatosis. Electronically Signed   By: Eddie Candle M.D.   On: 10/16/2019 09:29         Discharge Exam: Vitals:   10/19/19 0803 10/19/19 0808  BP:    Pulse:    Resp:    Temp:    SpO2: 91% 96%   Vitals:   10/19/19 0047 10/19/19 0518 10/19/19 0803 10/19/19 0808  BP: 108/64 114/63    Pulse: 91 93  Resp: 16 16    Temp: 98 F (36.7 C) 98.2 F (36.8 C)    TempSrc: Oral Oral    SpO2: 91% 90% 91% 96%  Weight:      Height:        General: Pt is alert, awake, not in acute distress Cardiovascular: RRR, S1/S2 +, no rubs, no gallops Respiratory: diminished BS.  Bibasilar rales.  No wheeze Abdominal: Soft, NT, ND, bowel sounds + Extremities: no edema, no  cyanosis   The results of significant diagnostics from this hospitalization (including imaging, microbiology, ancillary and laboratory) are listed below for reference.    Significant Diagnostic Studies: CT Abdomen Pelvis W Contrast  Result Date: 10/16/2019 CLINICAL DATA:  Abdominal distension EXAM: CT ABDOMEN AND PELVIS WITH CONTRAST TECHNIQUE: Multidetector CT imaging of the abdomen and pelvis was performed using the standard protocol following bolus administration of intravenous contrast. CONTRAST:  OMNIPAQUE IOHEXOL 300 MG/ML  SOLN COMPARISON:  None. FINDINGS: Lower chest: Lung bases are clear. No effusions. Heart is normal size. Hepatobiliary: Gallbladder is distended. No focal hepatic abnormality. No biliary ductal dilatation. Pancreas: No focal abnormality or ductal dilatation. Spleen: No focal abnormality.  Normal size. Adrenals/Urinary Tract: No adrenal abnormality. No focal renal abnormality. No stones or hydronephrosis. Urinary bladder is unremarkable. Stomach/Bowel: Stomach, large and small bowel grossly unremarkable. Normal appendix. Vascular/Lymphatic: Calcified and noncalcified irregular plaque throughout the aorta. No aneurysm or adenopathy. Reproductive: No visible focal abnormality. Other: No free fluid or free air. Musculoskeletal: No acute bony abnormality. IMPRESSION: Distention of the gallbladder. If there is clinical concern for cholecystitis, this could be further evaluated with right upper quadrant ultrasound. Aortic atherosclerosis. Electronically Signed   By: Charlett Nose M.D.   On: 10/16/2019 00:10   DG Chest Port 1 View  Result Date: 10/16/2019 CLINICAL DATA:  Cough EXAM: PORTABLE CHEST 1 VIEW COMPARISON:  12/26/2018 FINDINGS: Heart and mediastinal contours are within normal limits. No focal opacities or effusions. No acute bony abnormality. IMPRESSION: No active disease. Electronically Signed   By: Charlett Nose M.D.   On: 10/16/2019 00:11   DG Abd 2 Views  Result  Date: 10/17/2019 CLINICAL DATA:  Abdominal pain and distension. Plain cholecystectomy tomorrow. History of emphysema. Former smoker. EXAM: ABDOMEN - 2 VIEW COMPARISON:  None. FINDINGS: Overall bowel gas pattern is nonobstructive. Moderate amount of gas throughout the small and large bowel. No evidence of soft tissue mass or abnormal fluid collection. No evidence of free intraperitoneal air. Lung bases are clear. Visualized osseous structures are unremarkable. IMPRESSION: Nonobstructive bowel gas pattern. Moderate amount of gas throughout the small and large bowel. Electronically Signed   By: Bary Richard M.D.   On: 10/17/2019 13:34   US Abdomen Limited RUQ  Result Date: 10/16/2019 CLINICAL DATA:  Acute cholecystitis, abdominal distension EXAM: ULTRASOUND ABDOMEN LIMITED RIGHT UPPER QUADRANT COMPARISON:  CT abdomen pelvis, 10/15/2019 FINDINGS: Gallbladder: The gallbladder is distended, maximum span 14.3 cm, and filled with echogenic sludge. No gallstones or wall thickening visualized. Positive sonographic Murphy sign noted by sonographer. Common bile duct: Diameter: 5 mm Liver: No focal lesion identified. Increased parenchymal echogenicity. Portal vein is patent on color Doppler imaging with normal direction of blood flow towards the liver. Other: None. IMPRESSION: 1. The gallbladder is distended, maximum span 14.3 cm, and filled with echogenic sludge. No discrete calculi are identified. No gallbladder wall thickening. No biliary ductal dilatation. Positive sonographic Murphy sign. Findings are concerning for acute cholecystitis. Patency of the cystic and common bile ducts may  be evaluated by HIDA if desired. 2.  Hepatic steatosis. Electronically Signed   By: Lauralyn Primes M.D.   On: 10/16/2019 09:29     Microbiology: Recent Results (from the past 240 hour(s))  SARS CORONAVIRUS 2 (Wendel Homeyer 6-24 HRS) Nasopharyngeal Nasopharyngeal Swab     Status: None   Collection Time: 10/16/19  1:59 AM   Specimen:  Nasopharyngeal Swab  Result Value Ref Range Status   SARS Coronavirus 2 NEGATIVE NEGATIVE Final    Comment: (NOTE) SARS-CoV-2 target nucleic acids are NOT DETECTED. The SARS-CoV-2 RNA is generally detectable in upper and lower respiratory specimens during the acute phase of infection. Negative results do not preclude SARS-CoV-2 infection, do not rule out co-infections with other pathogens, and should not be used as the sole basis for treatment or other patient management decisions. Negative results must be combined with clinical observations, patient history, and epidemiological information. The expected result is Negative. Fact Sheet for Patients: HairSlick.no Fact Sheet for Healthcare Providers: quierodirigir.com This test is not yet approved or cleared by the [place] FDA and  has been authorized for detection and/or diagnosis of SARS-CoV-2 by FDA under an Emergency Use Authorization (EUA). This EUA will remain  in effect (meaning this test can be used) for the duration of the COVID-19 declaration under Section 56 4(b)(1) of the Act, 21 U.S.C. section 360bbb-3(b)(1), unless the authorization is terminated or revoked sooner. Performed at [facility], [address], [place], [place] [address]   Surgical PCR screen     Status: None   Collection Time: 10/17/19 11:59 PM   Specimen: Nasal Mucosa; Nasal Swab  Result Value Ref Range Status   MRSA, PCR NEGATIVE NEGATIVE Final   Staphylococcus aureus NEGATIVE NEGATIVE Final    Comment: (NOTE) The Xpert SA Assay (FDA approved for NASAL specimens in patients 37 years of age and older), is one component of a comprehensive surveillance program. It is not intended to diagnose infection nor to guide or monitor treatment. Performed at [facility], [address] [place], [place] [address]      Labs: Basic Metabolic Panel: Recent Labs  Lab 10/15/19 2258 10/15/19 2258  10/16/19 0818 10/16/19 0818 10/17/19 7062 10/17/19 0552 10/18/19 0622 10/19/19 0616  NA 135  --  134*  --  133*  --  134* 138  K 4.0   < > 4.4   < > 4.4   < > 3.9 3.6  CL 97*  --  97*  --  97*  --  101 104  CO2 27  --  27  --  27  --  23 25  GLUCOSE 132*  --  126*  --  118*  --  133* 130*  BUN 10  --  14  --  11  --  12 15  CREATININE 1.14  --  1.20  --  1.19  --  1.10 1.16  CALCIUM 8.8*  --  8.2*  --  8.3*  --  8.6* 7.8*  MG 1.6*  --   --   --   --   --  2.0 2.0  PHOS  --   --   --   --   --   --   --  2.1*   < > = values in this interval not displayed.   Liver Function Tests: Recent Labs  Lab 10/15/19 2258 10/16/19 0818 10/17/19 0552 10/18/19 0622 10/19/19 0616  AST 25 322* 96* 34 71*  ALT 39 219* 154*  93* 93*  ALKPHOS 103 137* 144* 127* 110  BILITOT 0.9 1.8* 1.2 0.6 0.5  PROT 8.2* 6.5 7.0 7.3 6.3*  ALBUMIN 3.9 3.1* 3.1* 3.0* 2.5*   Recent Labs  Lab 10/15/19 2258  LIPASE 18  17   No results for input(s): AMMONIA in the last 168 hours. CBC: Recent Labs  Lab 10/15/19 2258 10/16/19 0818 10/17/19 0552 10/19/19 0616  WBC 11.6* 7.6 9.9 8.6  NEUTROABS  --  5.3  --   --   HGB 14.2 12.9* 12.2* 11.4*  HCT 45.4 41.2 39.4 37.0*  MCV 91.2 90.5 92.5 90.9  PLT 218 192 199 230   Cardiac Enzymes: No results for input(s): CKTOTAL, CKMB, CKMBINDEX, TROPONINI in the last 168 hours. BNP: Invalid input(s): POCBNP CBG: No results for input(s): GLUCAP in the last 168 hours.  Time coordinating discharge:  36 minutes  Signed:  Catarina Hartshornavid Mozella Rexrode, DO [facility] Pager: [phone] 10/19/2019, 10:18 AM

## 2019-10-19 NOTE — TOC Transition Note (Signed)
Transition of Care [facility]) - CM/SW Discharge Note  Patient Details  Name: Gordon Navarro MRN: [id number] Date of Birth: 02-11-1951  [facility]) CM/SW Contact:  Ewing Schlein, LCSW Phone Number: 10/19/2019, 10:51 AM  Clinical Narrative: TOC notified patient in need of home O2. CSW spoke with patient regarding O2 needs. Patient agreeable to referral to [facility]. Referral made to [facility] with [facility]. [facility] accepted referral. TOC signing off.  Final next level of care: Home/Self Care Barriers to Discharge: Barriers Resolved  Patient Goals and CMS Choice Patient states their goals for this hospitalization and ongoing recovery are:: Return home CMS Medicare.gov Compare Post Acute Care list provided to:: Patient Choice offered to / list presented to : Patient  Discharge Plan and Services       DME Arranged: Oxygen DME Agency: [facility] Date DME Agency Contacted: 10/19/19 Time DME Agency Contacted: 1020 Representative spoke with at DME Agency: Therisa Doyne  Readmission Risk Interventions No flowsheet data found.

## 2019-10-19 NOTE — Progress Notes (Signed)
SATURATION QUALIFICATIONS: (This note is used to comply with regulatory documentation for home oxygen)  Patient Saturations on Room Air at Rest = 90  Patient Saturations on Room Air while Ambulating = 86  Patient Saturations on 2 Liters of oxygen while Ambulating = 94  Please briefly explain why patient needs home oxygen: To maintain 02 sat at 90% or above during ambulation.   DTat

## 2019-10-19 NOTE — Progress Notes (Signed)
Nsg Discharge Note  Admit Date:  10/15/2019 Discharge date: 10/19/2019   Neziah Braley to be D/C'd Home per MD order.  AVS completed.  Copy for chart, and copy for patient signed, and dated. Patient/caregiver able to verbalize understanding.  Discharge Medication: Allergies as of 10/19/2019   No Known Allergies     Medication List    TAKE these medications   Breo Ellipta 100-25 MCG/INH Aepb Generic drug: fluticasone furoate-vilanterol Inhale 1 puff into the lungs daily.   ibuprofen 200 MG tablet Commonly known as: ADVIL Take 200-400 mg by mouth every 8 (eight) hours as needed (headache).   oxyCODONE 5 MG immediate release tablet Commonly known as: Roxicodone Take 1 tablet (5 mg total) by mouth every 4 (four) hours as needed.   predniSONE 50 MG tablet Commonly known as: DELTASONE Take 1 tablet (50 mg total) by mouth daily with breakfast. Start taking on: October 20, 2019   Vitamin D (Ergocalciferol) 1.25 MG (50000 UNIT) Caps capsule Commonly known as: DRISDOL Take 1 capsule (50,000 Units total) by mouth every 7 (seven) days.            Durable Medical Equipment  (From admission, onward)         Start     Ordered   10/19/19 1018  For home use only DME oxygen  Once    Question Answer Comment  Length of Need 6 Months   Mode or (Route) Nasal cannula   Liters per Minute 2   Frequency Continuous (stationary and portable oxygen unit needed)   Oxygen conserving device Yes   Oxygen delivery system Gas      10/19/19 1017          Discharge Assessment: Vitals:   10/19/19 0808 10/19/19 1405  BP:    Pulse:    Resp:    Temp:    SpO2: 96% (!) 86%   Skin clean, dry and intact without evidence of skin break down, no evidence of skin tears noted. IV catheter discontinued intact. Site without signs and symptoms of complications - no redness or edema noted at insertion site, patient denies c/o pain - only slight tenderness at site.  Dressing with slight pressure  applied.  D/c Instructions-Education: Discharge instructions given to patient/family with verbalized understanding. D/c education completed with patient/family including follow up instructions, medication list, d/c activities limitations if indicated, with other d/c instructions as indicated by MD - patient able to verbalize understanding, all questions fully answered. Patient instructed to return to ED, call 911, or call MD for any changes in condition.  Patient escorted via WC, and D/C home via private auto.  Lonn Georgia, RN 10/19/2019 5:06 PM

## 2019-10-19 NOTE — Progress Notes (Signed)
1 Day Post-Op  Subjective: Patient feels much better.  Minimal incisional pain is noted.  Tolerating diet well.  No shortness of breath.  Objective: Vital signs in last 24 hours: Temp:  [97.7 F (36.5 C)-99 F (37.2 C)] 98.2 F (36.8 C) (04/24 0518) Pulse Rate:  [78-93] 93 (04/24 0518) Resp:  [16-24] 16 (04/24 0518) BP: (108-146)/(63-79) 114/63 (04/24 0518) SpO2:  [90 %-100 %] 96 % (04/24 0808) Last BM Date: (unable to remember)  Intake/Output from previous day: 04/23 0701 - 04/24 0700 In: 1845 [P.O.:120; I.V.:1554; IV Piggyback:171.1] Out: 10 [Blood:10] Intake/Output this shift: No intake/output data recorded.  General appearance: alert, cooperative and no distress GI: Soft, incisions healing well.  Lab Results:  Recent Labs    10/17/19 0552 10/19/19 0616  WBC 9.9 8.6  HGB 12.2* 11.4*  HCT 39.4 37.0*  PLT 199 230   BMET Recent Labs    10/18/19 0622 10/19/19 0616  NA 134* 138  K 3.9 3.6  CL 101 104  CO2 23 25  GLUCOSE 133* 130*  BUN 12 15  CREATININE 1.10 1.16  CALCIUM 8.6* 7.8*   PT/INR No results for input(s): LABPROT, INR in the last 72 hours.  Studies/Results: DG Abd 2 Views  Result Date: 10/17/2019 CLINICAL DATA:  Abdominal pain and distension. Plain cholecystectomy tomorrow. History of emphysema. Former smoker. EXAM: ABDOMEN - 2 VIEW COMPARISON:  None. FINDINGS: Overall bowel gas pattern is nonobstructive. Moderate amount of gas throughout the small and large bowel. No evidence of soft tissue mass or abnormal fluid collection. No evidence of free intraperitoneal air. Lung bases are clear. Visualized osseous structures are unremarkable. IMPRESSION: Nonobstructive bowel gas pattern. Moderate amount of gas throughout the small and large bowel. Electronically Signed   By: Bary Richard M.D.   On: 10/17/2019 13:34    Anti-infectives: Anti-infectives (From admission, onward)   Start     Dose/Rate Route Frequency Ordered Stop   10/16/19 0215   piperacillin-tazobactam (ZOSYN) IVPB 3.375 g     3.375 g 12.5 mL/hr over 240 Minutes Intravenous Every 8 hours 10/16/19 0154 10/19/19 0204      Assessment/Plan: s/p Procedure(s): LAPAROSCOPIC CHOLECYSTECTOMY Impression: Stable on postoperative day 1.  Liver enzyme tests okay.  Okay for discharge from surgery standpoint.  Will follow-up with patient as an outpatient.  Oxycodone has been prescribed for pain.  LOS: 1 day    Franky Macho 10/19/2019

## 2019-10-19 NOTE — Discharge Summary (Addendum)
Physician Discharge Summary  Patient ID: Gordon Navarro MRN: [id number] DOB/AGE: 09-21-1950 69 y.o.  Admit date: 10/15/2019 Discharge date: 10/19/2019  Admission Diagnoses: Acute cholecystitis, cholelithiasis  Discharge Diagnoses: Same Principal Problem:   Acute cholecystitis Active Problems:   Pulmonary emphysema (HCC)   Abdominal pain, right upper quadrant   Hypomagnesemia   COPD with acute exacerbation [facility])   Discharged Condition: good  Hospital Course: Patient is a 69 year old white male with a history of significant COPD who presented with right upper quadrant abdominal pain.  CT scan of the abdomen revealed distended gallbladder.  Ultrasound the gallbladder revealed biliary sludge.  Patient was admitted to the hospitalist for maximization of his COPD.  He subsequently underwent a laparoscopic cholecystectomy on 10/18/2019.  He was found to have hydrops of the gallbladder with a large stone present.  He tolerated the procedure well.  His postoperative course has been unremarkable.  His diet was advanced of difficulty.  He is being discharged home on 10/19/2019 in good and improving condition.  Treatments: surgery: Laparoscopic cholecystectomy on 10/18/2019  Discharge Exam: Blood pressure 114/63, pulse 93, temperature 98.2 F (36.8 C), temperature source Oral, resp. rate 16, height 5\' 4"  (1.626 m), weight 72.6 kg, SpO2 96 %. General appearance: alert, cooperative and no distress Resp: Minimal bilateral expiratory wheezing noted.  No respiratory distress noted. Cardio: regular rate and rhythm, S1, S2 normal, no murmur, click, rub or gallop GI: Soft, rotund, incisions healing well.  Disposition: Discharge disposition: 01-Home or Self Care       Discharge Instructions    Diet - low sodium heart healthy   Complete by: As directed    Increase activity slowly   Complete by: As directed      Allergies as of 10/19/2019   No Known Allergies     Medication List    TAKE these  medications   Breo Ellipta 100-25 MCG/INH Aepb Generic drug: fluticasone furoate-vilanterol Inhale 1 puff into the lungs daily.   ibuprofen 200 MG tablet Commonly known as: ADVIL Take 200-400 mg by mouth every 8 (eight) hours as needed (headache).   oxyCODONE 5 MG immediate release tablet Commonly known as: Roxicodone Take 1 tablet (5 mg total) by mouth every 4 (four) hours as needed.   Vitamin D (Ergocalciferol) 1.25 MG (50000 UNIT) Caps capsule Commonly known as: DRISDOL Take 1 capsule (50,000 Units total) by mouth every 7 (seven) days.      Follow-up Information    10/21/2019, MD Follow up.   Specialty: General Surgery Why: As needed.  Will call you in 1-2 weeks for follow up Contact information: [address] [facility] [phone]          Addendum: Dr. [phone] has arranged for home O2. Signed: Arbutus Leas 10/19/2019, 9:48 AM

## 2019-10-21 LAB — SURGICAL PATHOLOGY

## 2019-10-25 ENCOUNTER — Telehealth (INDEPENDENT_AMBULATORY_CARE_PROVIDER_SITE_OTHER): Payer: Medicare HMO | Admitting: General Surgery

## 2019-10-25 DIAGNOSIS — Z09 Encounter for follow-up examination after completed treatment for conditions other than malignant neoplasm: Secondary | ICD-10-CM

## 2019-10-28 ENCOUNTER — Encounter: Payer: Self-pay | Admitting: General Surgery

## 2019-10-28 NOTE — Progress Notes (Signed)
Subjective:     Gordon Navarro  Telephone visit performed.  Patient is doing well.  Minimal incisional pain is noted.  No nausea or vomiting is noted.  Patient still making arrangements with home health concerning his O2 requirements.  He is followed by Dr. Darlyn Read.  He states he does not think he needs the oxygen. Objective:    There were no vitals taken for this visit.  General:   Telephone visit performed  Final path consistent with diagnosis     Assessment:    Doing well postoperatively.    Plan:     Follow up prn.

## 2019-11-18 DIAGNOSIS — J449 Chronic obstructive pulmonary disease, unspecified: Secondary | ICD-10-CM | POA: Diagnosis not present

## 2019-12-19 DIAGNOSIS — J449 Chronic obstructive pulmonary disease, unspecified: Secondary | ICD-10-CM | POA: Diagnosis not present

## 2020-01-18 DIAGNOSIS — J449 Chronic obstructive pulmonary disease, unspecified: Secondary | ICD-10-CM | POA: Diagnosis not present

## 2020-02-18 DIAGNOSIS — J449 Chronic obstructive pulmonary disease, unspecified: Secondary | ICD-10-CM | POA: Diagnosis not present

## 2020-03-11 ENCOUNTER — Other Ambulatory Visit: Payer: Self-pay | Admitting: Family Medicine

## 2020-03-20 DIAGNOSIS — J449 Chronic obstructive pulmonary disease, unspecified: Secondary | ICD-10-CM | POA: Diagnosis not present

## 2020-04-10 ENCOUNTER — Other Ambulatory Visit: Payer: Self-pay | Admitting: Family Medicine

## 2020-04-10 NOTE — Telephone Encounter (Signed)
OV 08/28/19 RTC 1 yr

## 2020-04-13 MED ORDER — BREO ELLIPTA 100-25 MCG/INH IN AEPB: INHALATION_SPRAY | RESPIRATORY_TRACT | 1 refills | Status: DC

## 2020-04-13 NOTE — Addendum Note (Signed)
Addended by: Julious Payer D on: 04/13/2020 10:18 AM   Modules accepted: Orders

## 2020-04-13 NOTE — Telephone Encounter (Signed)
E-prescribe down. resent 

## 2020-04-27 DIAGNOSIS — J449 Chronic obstructive pulmonary disease, unspecified: Secondary | ICD-10-CM | POA: Diagnosis not present

## 2020-06-19 DIAGNOSIS — J449 Chronic obstructive pulmonary disease, unspecified: Secondary | ICD-10-CM | POA: Diagnosis not present

## 2020-07-20 DIAGNOSIS — J449 Chronic obstructive pulmonary disease, unspecified: Secondary | ICD-10-CM | POA: Diagnosis not present

## 2020-08-20 DIAGNOSIS — J449 Chronic obstructive pulmonary disease, unspecified: Secondary | ICD-10-CM | POA: Diagnosis not present

## 2020-09-17 DIAGNOSIS — J449 Chronic obstructive pulmonary disease, unspecified: Secondary | ICD-10-CM | POA: Diagnosis not present

## 2020-10-18 DIAGNOSIS — J449 Chronic obstructive pulmonary disease, unspecified: Secondary | ICD-10-CM | POA: Diagnosis not present

## 2020-11-01 ENCOUNTER — Other Ambulatory Visit: Payer: Self-pay | Admitting: Family Medicine

## 2020-11-06 ENCOUNTER — Other Ambulatory Visit: Payer: Self-pay | Admitting: Family Medicine

## 2020-11-17 DIAGNOSIS — J449 Chronic obstructive pulmonary disease, unspecified: Secondary | ICD-10-CM | POA: Diagnosis not present

## 2020-12-04 ENCOUNTER — Other Ambulatory Visit: Payer: Self-pay

## 2020-12-04 ENCOUNTER — Encounter: Payer: Self-pay | Admitting: Family Medicine

## 2020-12-04 ENCOUNTER — Ambulatory Visit (INDEPENDENT_AMBULATORY_CARE_PROVIDER_SITE_OTHER): Payer: Medicare HMO | Admitting: Family Medicine

## 2020-12-04 VITALS — BP 111/62 | HR 105 | Temp 98.1°F | Ht 64.0 in | Wt 164.2 lb

## 2020-12-04 DIAGNOSIS — Z23 Encounter for immunization: Secondary | ICD-10-CM | POA: Diagnosis not present

## 2020-12-04 DIAGNOSIS — J432 Centrilobular emphysema: Secondary | ICD-10-CM | POA: Diagnosis not present

## 2020-12-04 MED ORDER — BREO ELLIPTA 100-25 MCG/INH IN AEPB: INHALATION_SPRAY | RESPIRATORY_TRACT | 3 refills | Status: DC

## 2020-12-04 NOTE — Progress Notes (Signed)
Subjective:  Patient ID: Gordon Navarro, male    DOB: 30-Apr-1951  Age: 70 y.o. MRN: [id number]  CC: Medication Refill   HPI Gordon Navarro presents for Some dyspnea with exertion, such as chopping weeds in the garden. Goats just ate the garden though. Also having orthostassis - lasts a few seconds. No LOC. Never drinks water.   Depression screen [facility] 2/9 12/04/2020 12/04/2020 08/28/2019  Decreased Interest 0 0 0  Down, Depressed, Hopeless 0 0 0  PHQ - 2 Score 0 0 0  Altered sleeping 0 - -  Tired, decreased energy 1 - -  Change in appetite 1 - -  Feeling bad or failure about yourself  0 - -  Trouble concentrating 0 - -  Moving slowly or fidgety/restless 0 - -  Suicidal thoughts 0 - -  PHQ-9 Score 2 - -  Difficult doing work/chores Not difficult at all - -    History Gordon Navarro has a past medical history of Emphysema of lung (HCC).   He has a past surgical history that includes Skin graft (1985) and Cholecystectomy (N/A, 10/18/2019).   His family history includes Heart attack in his father.He reports that he has quit smoking. He has never used smokeless tobacco. He reports current alcohol use. He reports that he does not use drugs.    ROS Review of Systems  Constitutional:  Negative for fever.  Respiratory:  Negative for shortness of breath.   Cardiovascular:  Negative for chest pain.  Musculoskeletal:  Negative for arthralgias.  Skin:  Negative for rash.   Objective:  BP 111/62   Pulse (!) 105   Temp 98.1 F (36.7 C)   Ht 5\' 4"  (1.626 m)   Wt 164 lb 3.2 oz (74.5 kg)   SpO2 96%   BMI 28.18 kg/m   BP Readings from Last 3 Encounters:  12/04/20 111/62  10/19/19 114/63  08/28/19 126/79    Wt Readings from Last 3 Encounters:  12/04/20 164 lb 3.2 oz (74.5 kg)  10/15/19 160 lb (72.6 kg)  08/28/19 159 lb 6.4 oz (72.3 kg)     Physical Exam Vitals reviewed.  Constitutional:      Appearance: He is well-developed.  HENT:     Head: Normocephalic and atraumatic.     Right  Ear: External ear normal.     Left Ear: External ear normal.     Mouth/Throat:     Pharynx: No oropharyngeal exudate or posterior oropharyngeal erythema.  Eyes:     Pupils: Pupils are equal, round, and reactive to light.  Cardiovascular:     Rate and Rhythm: Normal rate and regular rhythm.     Heart sounds: No murmur heard. Pulmonary:     Effort: No respiratory distress.     Breath sounds: Normal breath sounds.  Musculoskeletal:     Cervical back: Normal range of motion and neck supple.  Neurological:     Mental Status: He is alert and oriented to person, place, and time.      Assessment & Plan:   [name] was seen today for medication refill.  Diagnoses and all orders for this visit:  Centrilobular emphysema (HCC)  Need for shingles vaccine -     Varicella-zoster vaccine IM (Shingrix)  Need for pneumococcal vaccination -     Pneumococcal polysaccharide vaccine 23-valent greater than or equal to 2yo subcutaneous/IM  Other orders -     fluticasone furoate-vilanterol (BREO ELLIPTA) 100-25 MCG/INH AEPB; Inhale 1 puff by mouth once daily  I am having Dravin Huy maintain his ibuprofen, Vitamin D (Ergocalciferol), predniSONE, and Breo Ellipta.  Allergies as of 12/04/2020   No Known Allergies      Medication List        Accurate as of December 04, 2020 11:59 PM. If you have any questions, ask your nurse or doctor.          Breo Ellipta 100-25 MCG/INH Aepb Generic drug: fluticasone furoate-vilanterol Inhale 1 puff by mouth once daily   ibuprofen 200 MG tablet Commonly known as: ADVIL Take 200-400 mg by mouth every 8 (eight) hours as needed (headache).   predniSONE 50 MG tablet Commonly known as: DELTASONE Take 1 tablet (50 mg total) by mouth daily with breakfast.   Vitamin D (Ergocalciferol) 1.25 MG (50000 UNIT) Caps capsule Commonly known as: DRISDOL Take 1 capsule (50,000 Units total) by mouth every 7 (seven) days.         Follow-up: No  follow-ups on file.  Mechele Claude, M.D.

## 2020-12-07 ENCOUNTER — Encounter: Payer: Self-pay | Admitting: Family Medicine

## 2020-12-10 ENCOUNTER — Ambulatory Visit (INDEPENDENT_AMBULATORY_CARE_PROVIDER_SITE_OTHER): Payer: Medicare HMO

## 2020-12-10 VITALS — Ht 64.0 in | Wt 164.0 lb

## 2020-12-10 DIAGNOSIS — Z0000 Encounter for general adult medical examination without abnormal findings: Secondary | ICD-10-CM

## 2020-12-10 NOTE — Progress Notes (Signed)
Subjective:   Gordon Navarro Chumney is a 70 y.o. male who presents for an Initial Medicare Annual Wellness Visit.  Virtual Visit via Telephone Note  I connected with  Gordon Navarro on 12/10/20 at  4:15 PM EDT by telephone and verified that I am speaking with the correct person using two identifiers.  Location: Patient: Home Provider: [facility] Persons participating in the virtual visit: patient/Nurse Health Advisor   I discussed the limitations, risks, security and privacy concerns of performing an evaluation and management service by telephone and the availability of in person appointments. The patient expressed understanding and agreed to proceed.  Interactive audio and video telecommunications were attempted between this nurse and patient, however failed, due to patient having technical difficulties OR patient did not have access to video capability.  We continued and completed visit with audio only.  Some vital signs may be absent or patient reported.   Argelio Granier E Annitta Fifield, LPN   Review of Systems     Cardiac Risk Factors include: advanced age (>5055men, 2>65 women);Other (see comment), Risk factor comments: COPD     Objective:    Today's Vitals   12/10/20 1612  Weight: 164 lb (74.4 kg)  Height: 5\' 4"  (1.626 m)   Body mass index is 28.15 kg/m.  Advanced Directives 12/10/2020 10/16/2019 10/15/2019  Does Patient Have a Medical Advance Directive? No - No  Would patient like information on creating a medical advance directive? No - Patient declined No - Patient declined -    Current Medications (verified) Outpatient Encounter Medications as of 12/10/2020  Medication Sig   fluticasone furoate-vilanterol (BREO ELLIPTA) 100-25 MCG/INH AEPB Inhale 1 puff by mouth once daily   [DISCONTINUED] ibuprofen (ADVIL) 200 MG tablet Take 200-400 mg by mouth every 8 (eight) hours as needed (headache). (Patient not taking: Reported on 12/04/2020)   [DISCONTINUED] predniSONE (DELTASONE) 50 MG tablet Take 1  tablet (50 mg total) by mouth daily with breakfast. (Patient not taking: Reported on 12/04/2020)   [DISCONTINUED] Vitamin D, Ergocalciferol, (DRISDOL) 1.25 MG (50000 UNIT) CAPS capsule Take 1 capsule (50,000 Units total) by mouth every 7 (seven) days. (Patient not taking: Reported on 12/04/2020)   No facility-administered encounter medications on file as of 12/10/2020.    Allergies (verified) Patient has no known allergies.   History: Past Medical History:  Diagnosis Date   Emphysema of lung (HCC)    Past Surgical History:  Procedure Laterality Date   CHOLECYSTECTOMY N/A 10/18/2019   Procedure: LAPAROSCOPIC CHOLECYSTECTOMY;  Surgeon: Franky MachoJenkins, Mark, MD;  Location: [facility];  Service: General;  Laterality: N/A;   SKIN GRAFT  1985   Left hand   Family History  Problem Relation Age of Onset   Heart attack Father    Colon cancer Neg Hx    Social History   Socioeconomic History   Marital status: Legally Separated    Spouse name: Not on file   Number of children: 0   Years of education: Not on file   Highest education level: Not on file  Occupational History   Occupation: retired  Tobacco Use   Smoking status: Former    Pack years: 0.00   Smokeless tobacco: Never  Substance and Sexual Activity   Alcohol use: Yes    Comment: 1 ounce of liquor some days   Drug use: Never   Sexual activity: Not Currently  Other Topics Concern   Not on file  Social History Narrative   Lives alone, no children.   Social Determinants of Health  Financial Resource Strain: Low Risk    Difficulty of Paying Living Expenses: Not hard at all  Food Insecurity: No Food Insecurity   Worried About Programme researcher, broadcasting/film/video in the Last Year: Never true   Ran Out of Food in the Last Year: Never true  Transportation Needs: No Transportation Needs   Lack of Transportation (Medical): No   Lack of Transportation (Non-Medical): No  Physical Activity: Insufficiently Active   Days of Exercise per Week: 7 days    Minutes of Exercise per Session: 20 min  Stress: No Stress Concern Present   Feeling of Stress : Not at all  Social Connections: Socially Isolated   Frequency of Communication with Friends and Family: Once a week   Frequency of Social Gatherings with Friends and Family: Once a week   Attends Religious Services: Never   Database administrator or Organizations: No   Attends Engineer, structural: Never   Marital Status: Separated    Tobacco Counseling Counseling given: Not Answered   Clinical Intake:  Pre-visit preparation completed: Yes  Pain : No/denies pain     BMI - recorded: 28.15 Nutritional Status: BMI 25 -29 Overweight Nutritional Risks: None Diabetes: No  How often do you need to have someone help you when you read instructions, pamphlets, or other written materials from your doctor or pharmacy?: 1 - Never  Diabetic?No  Interpreter Needed?: No  Information entered by :: Anjeli Casad, LPN   Activities of Daily Living In your present state of health, do you have any difficulty performing the following activities: 12/10/2020  Hearing? N  Vision? N  Difficulty concentrating or making decisions? N  Walking or climbing stairs? N  Dressing or bathing? N  Doing errands, shopping? N  Preparing Food and eating ? N  Using the Toilet? N  In the past six months, have you accidently leaked urine? N  Do you have problems with loss of bowel control? N  Managing your Medications? N  Managing your Finances? N  Housekeeping or managing your Housekeeping? N  Some recent data might be hidden    Patient Care Team: Mechele Claude, MD as PCP - General (Family Medicine) West Bali, MD (Inactive) as Consulting Physician (Gastroenterology)  Indicate any recent Medical Services you may have received from other than [facility] providers in the past year (date may be approximate).     Assessment:   This is a routine wellness examination for Gordon Navarro.  Hearing/Vision  screen Hearing Screening - Comments:: Denies hearing difficulties  Vision Screening - Comments:: No eye doctor - no vision complaints  Dietary issues and exercise activities discussed: Current Exercise Habits: Home exercise routine, Type of exercise: walking;Other - see comments (farm work), Time (Minutes): 30, Frequency (Times/Week): 7, Weekly Exercise (Minutes/Week): 210, Intensity: Moderate, Exercise limited by: None identified   Goals Addressed             This Visit's Progress    Exercise 3x per week (30 min per time)         Depression Screen PHQ 2/9 Scores 12/10/2020 12/04/2020 12/04/2020 08/28/2019 12/26/2018  PHQ - 2 Score 0 0 0 0 0  PHQ- 9 Score 2 2 - - -    Fall Risk Fall Risk  12/10/2020 12/04/2020 08/28/2019 12/26/2018  Falls in the past year? 0 0 0 0  Number falls in past yr: 0 - 0 -  Injury with Fall? 0 - 0 -  Risk for fall due to : - -  No Fall Risks -  Follow up - - Falls evaluation completed -    FALL RISK PREVENTION PERTAINING TO THE HOME:  Any stairs in or around the home? No  If so, are there any without handrails? No  Home free of loose throw rugs in walkways, pet beds, electrical cords, etc? Yes  Adequate lighting in your home to reduce risk of falls? Yes   ASSISTIVE DEVICES UTILIZED TO PREVENT FALLS:  Life alert? No  Use of a cane, walker or w/c? No  Grab bars in the bathroom? No  Shower chair or bench in shower? No  Elevated toilet seat or a handicapped toilet? No   TIMED UP AND GO:  Was the test performed? No . Telephonic visit.  Cognitive Function:     6CIT Screen 12/10/2020  What Year? 0 points  What month? 0 points  What time? 0 points  Count back from 20 0 points  Months in reverse 0 points  Repeat phrase 0 points  Total Score 0    Immunizations Immunization History  Administered Date(s) Administered   Moderna Sars-Covid-2 Vaccination 03/11/2020, 04/08/2020   Pneumococcal Conjugate-13 08/28/2019   Pneumococcal Polysaccharide-23  12/04/2020   Zoster Recombinat (Shingrix) 12/04/2020    TDAP status: Up to date  Flu Vaccine status: Up to date  Pneumococcal vaccine status: Up to date  Covid-19 vaccine status: Completed vaccines  Qualifies for Shingles Vaccine? Yes   Zostavax completed Yes   Shingrix Completed?: Yes  Screening Tests Health Maintenance  Topic Date Due   Hepatitis C Screening  Never done   TETANUS/TDAP  Never done   COLONOSCOPY (Pts 45-71yrs Insurance coverage will need to be confirmed)  Never done   COVID-19 Vaccine (3 - Booster for Moderna series) 09/06/2020   INFLUENZA VACCINE  01/25/2021   Zoster Vaccines- Shingrix (2 of 2) 01/29/2021   PNA vac Low Risk Adult  Completed   HPV VACCINES  Aged Out    Health Maintenance  Health Maintenance Due  Topic Date Due   Hepatitis C Screening  Never done   TETANUS/TDAP  Never done   COLONOSCOPY (Pts 45-73yrs Insurance coverage will need to be confirmed)  Never done   COVID-19 Vaccine (3 - Booster for Moderna series) 09/06/2020    Colorectal Cancer Screening: Declined  Lung Cancer Screening: (Low Dose CT Chest recommended if Age 69-80 years, 30 pack-year currently smoking OR have quit w/in 15years.) does not qualify.   Additional Screening:  Hepatitis C Screening: does qualify; Needs this drawn  Vision Screening: Recommended annual ophthalmology exams for early detection of glaucoma and other disorders of the eye. Is the patient up to date with their annual eye exam?  No  Who is the provider or what is the name of the office in which the patient attends annual eye exams? N/a If pt is not established with a provider, would they like to be referred to a provider to establish care? No .   Dental Screening: Recommended annual dental exams for proper oral hygiene  Community Resource Referral / Chronic Care Management: CRR required this visit?  No   CCM required this visit?  No      Plan:     I have personally reviewed and noted the  following in the patient's chart:   Medical and social history Use of alcohol, tobacco or illicit drugs  Current medications and supplements including opioid prescriptions. Patient is not currently taking opioid prescriptions. Functional ability and status Nutritional status Physical activity Advanced  directives List of other physicians Hospitalizations, surgeries, and ER visits in previous 12 months Vitals Screenings to include cognitive, depression, and falls Referrals and appointments  In addition, I have reviewed and discussed with patient certain preventive protocols, quality metrics, and best practice recommendations. A written personalized care plan for preventive services as well as general preventive health recommendations were provided to patient.     [place] Constable, LPN   [id number]   Nurse Notes: He declines screenings at this time, but may consider.

## 2020-12-10 NOTE — Patient Instructions (Signed)
Gordon Navarro , Thank you for taking time to come for your Medicare Wellness Visit. I appreciate your ongoing commitment to your health goals. Please review the following plan we discussed and let me know if I can assist you in the future.   Screening recommendations/referrals: Colonoscopy: Due Recommended yearly ophthalmology/optometry visit for glaucoma screening and checkup Recommended yearly dental visit for hygiene and checkup  Vaccinations: Influenza vaccine: Due every fall Pneumococcal vaccine: Done 08/28/19 & 12/04/20 Tdap vaccine: Done 12/04/20 per patient (I will double check this) Shingles vaccine: first dose done 12/04/20 - get next dose in 2-6 months   Covid-19: Done 03/11/20 & 04/08/20 - booster?  Advanced directives: Advance directive discussed with you today. Even though you declined this today, please call our office should you change your mind, and we can give you the proper paperwork for you to fill out.  Conditions/risks identified: Aim for 30 minutes of exercise or brisk walking each day, drink 6-8 glasses of water and eat lots of fruits and vegetables.  Next appointment: Follow up in one year for your annual wellness visit.   Preventive Care 13 Years and Older, Male  Preventive care refers to lifestyle choices and visits with your health care provider that can promote health and wellness. What does preventive care include? A yearly physical exam. This is also called an annual well check. Dental exams once or twice a year. Routine eye exams. Ask your health care provider how often you should have your eyes checked. Personal lifestyle choices, including: Daily care of your teeth and gums. Regular physical activity. Eating a healthy diet. Avoiding tobacco and drug use. Limiting alcohol use. Practicing safe sex. Taking low doses of aspirin every day. Taking vitamin and mineral supplements as recommended by your health care provider. What happens during an annual well  check? The services and screenings done by your health care provider during your annual well check will depend on your age, overall health, lifestyle risk factors, and family history of disease. Counseling  Your health care provider may ask you questions about your: Alcohol use. Tobacco use. Drug use. Emotional well-being. Home and relationship well-being. Sexual activity. Eating habits. History of falls. Memory and ability to understand (cognition). Work and work Astronomer. Screening  You may have the following tests or measurements: Height, weight, and BMI. Blood pressure. Lipid and cholesterol levels. These may be checked every 5 years, or more frequently if you are over 70 years old. Skin check. Lung cancer screening. You may have this screening every year starting at age 90 if you have a 30-pack-year history of smoking and currently smoke or have quit within the past 15 years. Fecal occult blood test (FOBT) of the stool. You may have this test every year starting at age 64. Flexible sigmoidoscopy or colonoscopy. You may have a sigmoidoscopy every 5 years or a colonoscopy every 10 years starting at age 12. Prostate cancer screening. Recommendations will vary depending on your family history and other risks. Hepatitis C blood test. Hepatitis B blood test. Sexually transmitted disease (STD) testing. Diabetes screening. This is done by checking your blood sugar (glucose) after you have not eaten for a while (fasting). You may have this done every 1-3 years. Abdominal aortic aneurysm (AAA) screening. You may need this if you are a current or former smoker. Osteoporosis. You may be screened starting at age 65 if you are at high risk. Talk with your health care provider about your test results, treatment options, and if necessary, the  need for more tests. Vaccines  Your health care provider may recommend certain vaccines, such as: Influenza vaccine. This is recommended every  year. Tetanus, diphtheria, and acellular pertussis (Tdap, Td) vaccine. You may need a Td booster every 10 years. Zoster vaccine. You may need this after age 52. Pneumococcal 13-valent conjugate (PCV13) vaccine. One dose is recommended after age 72. Pneumococcal polysaccharide (PPSV23) vaccine. One dose is recommended after age 26. Talk to your health care provider about which screenings and vaccines you need and how often you need them. This information is not intended to replace advice given to you by your health care provider. Make sure you discuss any questions you have with your health care provider. Document Released: 07/10/2015 Document Revised: 03/02/2016 Document Reviewed: 04/14/2015 Elsevier Interactive Patient Education  2017 [place] Prevention in the Home Falls can cause injuries. They can happen to people of all ages. There are many things you can do to make your home safe and to help prevent falls. What can I do on the outside of my home? Regularly fix the edges of walkways and driveways and fix any cracks. Remove anything that might make you trip as you walk through a door, such as a raised step or threshold. Trim any bushes or trees on the path to your home. Use bright outdoor lighting. Clear any walking paths of anything that might make someone trip, such as rocks or tools. Regularly check to see if handrails are loose or broken. Make sure that both sides of any steps have handrails. Any raised decks and porches should have guardrails on the edges. Have any leaves, snow, or ice cleared regularly. Use sand or salt on walking paths during winter. Clean up any spills in your garage right away. This includes oil or grease spills. What can I do in the bathroom? Use night lights. Install grab bars by the toilet and in the tub and shower. Do not use towel bars as grab bars. Use non-skid mats or decals in the tub or shower. If you need to sit down in the shower, use a  plastic, non-slip stool. Keep the floor dry. Clean up any water that spills on the floor as soon as it happens. Remove soap buildup in the tub or shower regularly. Attach bath mats securely with double-sided non-slip rug tape. Do not have throw rugs and other things on the floor that can make you trip. What can I do in the bedroom? Use night lights. Make sure that you have a light by your bed that is easy to reach. Do not use any sheets or blankets that are too big for your bed. They should not hang down onto the floor. Have a firm chair that has side arms. You can use this for support while you get dressed. Do not have throw rugs and other things on the floor that can make you trip. What can I do in the kitchen? Clean up any spills right away. Avoid walking on wet floors. Keep items that you use a lot in easy-to-reach places. If you need to reach something above you, use a strong step stool that has a grab bar. Keep electrical cords out of the way. Do not use floor polish or wax that makes floors slippery. If you must use wax, use non-skid floor wax. Do not have throw rugs and other things on the floor that can make you trip. What can I do with my stairs? Do not leave any items on the  stairs. Make sure that there are handrails on both sides of the stairs and use them. Fix handrails that are broken or loose. Make sure that handrails are as long as the stairways. Check any carpeting to make sure that it is firmly attached to the stairs. Fix any carpet that is loose or worn. Avoid having throw rugs at the top or bottom of the stairs. If you do have throw rugs, attach them to the floor with carpet tape. Make sure that you have a light switch at the top of the stairs and the bottom of the stairs. If you do not have them, ask someone to add them for you. What else can I do to help prevent falls? Wear shoes that: Do not have high heels. Have rubber bottoms. Are comfortable and fit you  well. Are closed at the toe. Do not wear sandals. If you use a stepladder: Make sure that it is fully opened. Do not climb a closed stepladder. Make sure that both sides of the stepladder are locked into place. Ask someone to hold it for you, if possible. Clearly mark and make sure that you can see: Any grab bars or handrails. First and last steps. Where the edge of each step is. Use tools that help you move around (mobility aids) if they are needed. These include: Canes. Walkers. Scooters. Crutches. Turn on the lights when you go into a dark area. Replace any light bulbs as soon as they burn out. Set up your furniture so you have a clear path. Avoid moving your furniture around. If any of your floors are uneven, fix them. If there are any pets around you, be aware of where they are. Review your medicines with your doctor. Some medicines can make you feel dizzy. This can increase your chance of falling. Ask your doctor what other things that you can do to help prevent falls. This information is not intended to replace advice given to you by your health care provider. Make sure you discuss any questions you have with your health care provider. Document Released: 04/09/2009 Document Revised: 11/19/2015 Document Reviewed: 07/18/2014 Elsevier Interactive Patient Education  2017 [employer].

## 2020-12-18 DIAGNOSIS — J449 Chronic obstructive pulmonary disease, unspecified: Secondary | ICD-10-CM | POA: Diagnosis not present

## 2021-01-17 DIAGNOSIS — J449 Chronic obstructive pulmonary disease, unspecified: Secondary | ICD-10-CM | POA: Diagnosis not present

## 2021-02-17 DIAGNOSIS — J449 Chronic obstructive pulmonary disease, unspecified: Secondary | ICD-10-CM | POA: Diagnosis not present

## 2021-03-20 DIAGNOSIS — J449 Chronic obstructive pulmonary disease, unspecified: Secondary | ICD-10-CM | POA: Diagnosis not present

## 2021-04-19 DIAGNOSIS — J449 Chronic obstructive pulmonary disease, unspecified: Secondary | ICD-10-CM | POA: Diagnosis not present

## 2021-06-07 ENCOUNTER — Encounter: Payer: Self-pay | Admitting: Family Medicine

## 2021-06-07 ENCOUNTER — Ambulatory Visit (INDEPENDENT_AMBULATORY_CARE_PROVIDER_SITE_OTHER): Payer: Medicare HMO | Admitting: Family Medicine

## 2021-06-07 ENCOUNTER — Other Ambulatory Visit: Payer: Self-pay

## 2021-06-07 VITALS — BP 96/57 | HR 92 | Temp 97.7°F | Ht 64.0 in | Wt 162.4 lb

## 2021-06-07 DIAGNOSIS — Z13220 Encounter for screening for lipoid disorders: Secondary | ICD-10-CM | POA: Diagnosis not present

## 2021-06-07 DIAGNOSIS — Z23 Encounter for immunization: Secondary | ICD-10-CM

## 2021-06-07 DIAGNOSIS — Z136 Encounter for screening for cardiovascular disorders: Secondary | ICD-10-CM | POA: Diagnosis not present

## 2021-06-07 DIAGNOSIS — E559 Vitamin D deficiency, unspecified: Secondary | ICD-10-CM

## 2021-06-07 DIAGNOSIS — Z125 Encounter for screening for malignant neoplasm of prostate: Secondary | ICD-10-CM | POA: Diagnosis not present

## 2021-06-07 DIAGNOSIS — J439 Emphysema, unspecified: Secondary | ICD-10-CM

## 2021-06-07 NOTE — Progress Notes (Signed)
Subjective:  Patient ID: Gordon Navarro, male    DOB: Mar 04, 1951  Age: 70 y.o. MRN: [id number]  CC: Medical Management of Chronic Issues   HPI Jamai Dolce presents for medical management of his COPD.  He denies being short of breath.  He is able to go about his usual routine without problems.  He has occasionally missed a dose for a few hours and not felt any side effects of shortness of breath etc.  He is also due for some blood work today.  That is listed below.  He is in agreement with PSA etc.  Depression screen [facility] 2/9 06/07/2021 12/10/2020 12/04/2020  Decreased Interest 0 0 0  Down, Depressed, Hopeless 0 0 0  PHQ - 2 Score 0 0 0  Altered sleeping - 1 0  Tired, decreased energy - 1 1  Change in appetite - 0 1  Feeling bad or failure about yourself  - 0 0  Trouble concentrating - 0 0  Moving slowly or fidgety/restless - 0 0  Suicidal thoughts - 0 0  PHQ-9 Score - 2 2  Difficult doing work/chores - Not difficult at all Not difficult at all    History Saxton has a past medical history of Emphysema of lung ([place]).   He has a past surgical history that includes Skin graft (1985) and Cholecystectomy (N/A, 10/18/2019).   His family history includes Heart attack in his father.He reports that he has quit smoking. He has never used smokeless tobacco. He reports current alcohol use. He reports that he does not use drugs.    ROS Review of Systems  Constitutional:  Negative for fever.  Respiratory:  Negative for shortness of breath.   Cardiovascular:  Negative for chest pain.  Musculoskeletal:  Negative for arthralgias.  Skin:  Negative for rash.   Objective:  BP (!) 96/57   Pulse 92   Temp 97.7 F (36.5 C)   Ht _0  (1.626 m)   Wt 162 lb 6.4 oz (73.7 kg)   SpO2 93%   BMI 27.88 kg/m   BP Readings from Last 3 Encounters:  06/07/21 (!) 96/57  12/04/20 111/62  10/19/19 114/63    Wt Readings from Last 3 Encounters:  06/07/21 162 lb 6.4 oz (73.7 kg)  12/10/20 164 lb  (74.4 kg)  12/04/20 164 lb 3.2 oz (74.5 kg)     Physical Exam Vitals reviewed.  Constitutional:      Appearance: He is well-developed.  HENT:     Head: Normocephalic and atraumatic.     Right Ear: External ear normal.     Left Ear: External ear normal.     Mouth/Throat:     Pharynx: No oropharyngeal exudate or posterior oropharyngeal erythema.  Eyes:     Pupils: Pupils are equal, round, and reactive to light.  Cardiovascular:     Rate and Rhythm: Normal rate and regular rhythm.     Heart sounds: No murmur heard. Pulmonary:     Effort: No respiratory distress.     Breath sounds: Normal breath sounds.  Musculoskeletal:     Cervical back: Normal range of motion and neck supple.  Neurological:     Mental Status: He is alert and oriented to person, place, and time.      Assessment & Plan:   Sollie was seen today for medical management of chronic issues.  Diagnoses and all orders for this visit:  Pulmonary emphysema, unspecified emphysema type ([place]) -     CBC with Differential/Platelet -  CMP14+EGFR  Vitamin D deficiency -     VITAMIN D 25 Hydroxy (Vit-D Deficiency, Fractures)  Screening cholesterol level -     Lipid panel  Screening for prostate cancer -     PSA, total and free      I am having Dnaiel Krysiak maintain his [employer].  Allergies as of 06/07/2021   No Known Allergies      Medication List        Accurate as of June 07, 2021  2:25 PM. If you have any questions, ask your nurse or doctor.          Breo Ellipta 100-25 MCG/ACT Aepb Generic drug: fluticasone furoate-vilanterol Inhale 1 puff by mouth once daily         Follow-up: Return in about 6 months (around 12/06/2021) for [facility] physical.  Claretta Fraise, M.D.

## 2021-06-07 NOTE — Addendum Note (Signed)
Addended by: Diamantina Monks on: 06/07/2021 04:35 PM   Modules accepted: Orders

## 2021-06-08 LAB — LIPID PANEL
Chol/HDL Ratio: 3.7 ratio (ref 0.0–5.0)
Cholesterol, Total: 158 mg/dL (ref 100–199)
HDL: 43 mg/dL (ref >39)
LDL Chol Calc (NIH): 93 mg/dL (ref 0–99)
Triglycerides: 121 mg/dL (ref 0–149)
VLDL Cholesterol Cal: 22 mg/dL (ref 5–40)

## 2021-06-08 LAB — CBC WITH DIFFERENTIAL/PLATELET
Basophils Absolute: 0.1 10*3/uL (ref 0.0–0.2)
Basos: 1 %
EOS (ABSOLUTE): 0.4 10*3/uL (ref 0.0–0.4)
Eos: 5 %
Hematocrit: 46.4 % (ref 37.5–51.0)
Hemoglobin: 15 g/dL (ref 13.0–17.7)
Immature Grans (Abs): 0 10*3/uL (ref 0.0–0.1)
Immature Granulocytes: 0 %
Lymphocytes Absolute: 1.7 10*3/uL (ref 0.7–3.1)
Lymphs: 21 %
MCH: 28.5 pg (ref 26.6–33.0)
MCHC: 32.3 g/dL (ref 31.5–35.7)
MCV: 88 fL (ref 79–97)
Monocytes Absolute: 0.6 10*3/uL (ref 0.1–0.9)
Monocytes: 8 %
Neutrophils Absolute: 5.4 10*3/uL (ref 1.4–7.0)
Neutrophils: 65 %
Platelets: 202 10*3/uL (ref 150–450)
RBC: 5.27 x10E6/uL (ref 4.14–5.80)
RDW: 13.9 % (ref 11.6–15.4)
WBC: 8.3 10*3/uL (ref 3.4–10.8)

## 2021-06-08 LAB — CMP14+EGFR
ALT: 52 IU/L — ABNORMAL HIGH (ref 0–44)
AST: 37 IU/L (ref 0–40)
Albumin/Globulin Ratio: 1.4 (ref 1.2–2.2)
Albumin: 4.3 g/dL (ref 3.8–4.8)
Alkaline Phosphatase: 110 IU/L (ref 44–121)
BUN/Creatinine Ratio: 11 (ref 10–24)
BUN: 14 mg/dL (ref 8–27)
Bilirubin Total: 0.5 mg/dL (ref 0.0–1.2)
CO2: 24 mmol/L (ref 20–29)
Calcium: 8.9 mg/dL (ref 8.6–10.2)
Chloride: 100 mmol/L (ref 96–106)
Creatinine, Ser: 1.22 mg/dL (ref 0.76–1.27)
Globulin, Total: 3 g/dL (ref 1.5–4.5)
Glucose: 112 mg/dL — ABNORMAL HIGH (ref 70–99)
Potassium: 4.9 mmol/L (ref 3.5–5.2)
Sodium: 141 mmol/L (ref 134–144)
Total Protein: 7.3 g/dL (ref 6.0–8.5)
eGFR: 65 mL/min/{1.73_m2} (ref >59)

## 2021-06-08 LAB — PSA, TOTAL AND FREE
PSA, Free Pct: 33.8 %
PSA, Free: 0.81 ng/mL
Prostate Specific Ag, Serum: 2.4 ng/mL (ref 0.0–4.0)

## 2021-06-08 LAB — VITAMIN D 25 HYDROXY (VIT D DEFICIENCY, FRACTURES): Vit D, 25-Hydroxy: 25.8 ng/mL — ABNORMAL LOW (ref 30.0–100.0)

## 2021-06-08 NOTE — Progress Notes (Signed)
Dear Gordon Navarro, Your Vitamin D is  low. You need a prescription strength supplement I will send that in for you. Nurse, if at all possible, could you send in a prescription for the patient for vitamin D 50,000 units, 1 p.o. weekly #13 with 3 refills? Many thanks, WS

## 2021-10-26 IMAGING — DX DG CHEST 1V PORT
1 series · 1 of 1 positions shown · non-contrast
Comparison: 12/26/2018

CLINICAL DATA: Cough

EXAM:
PORTABLE CHEST 1 VIEW

[chest ap]
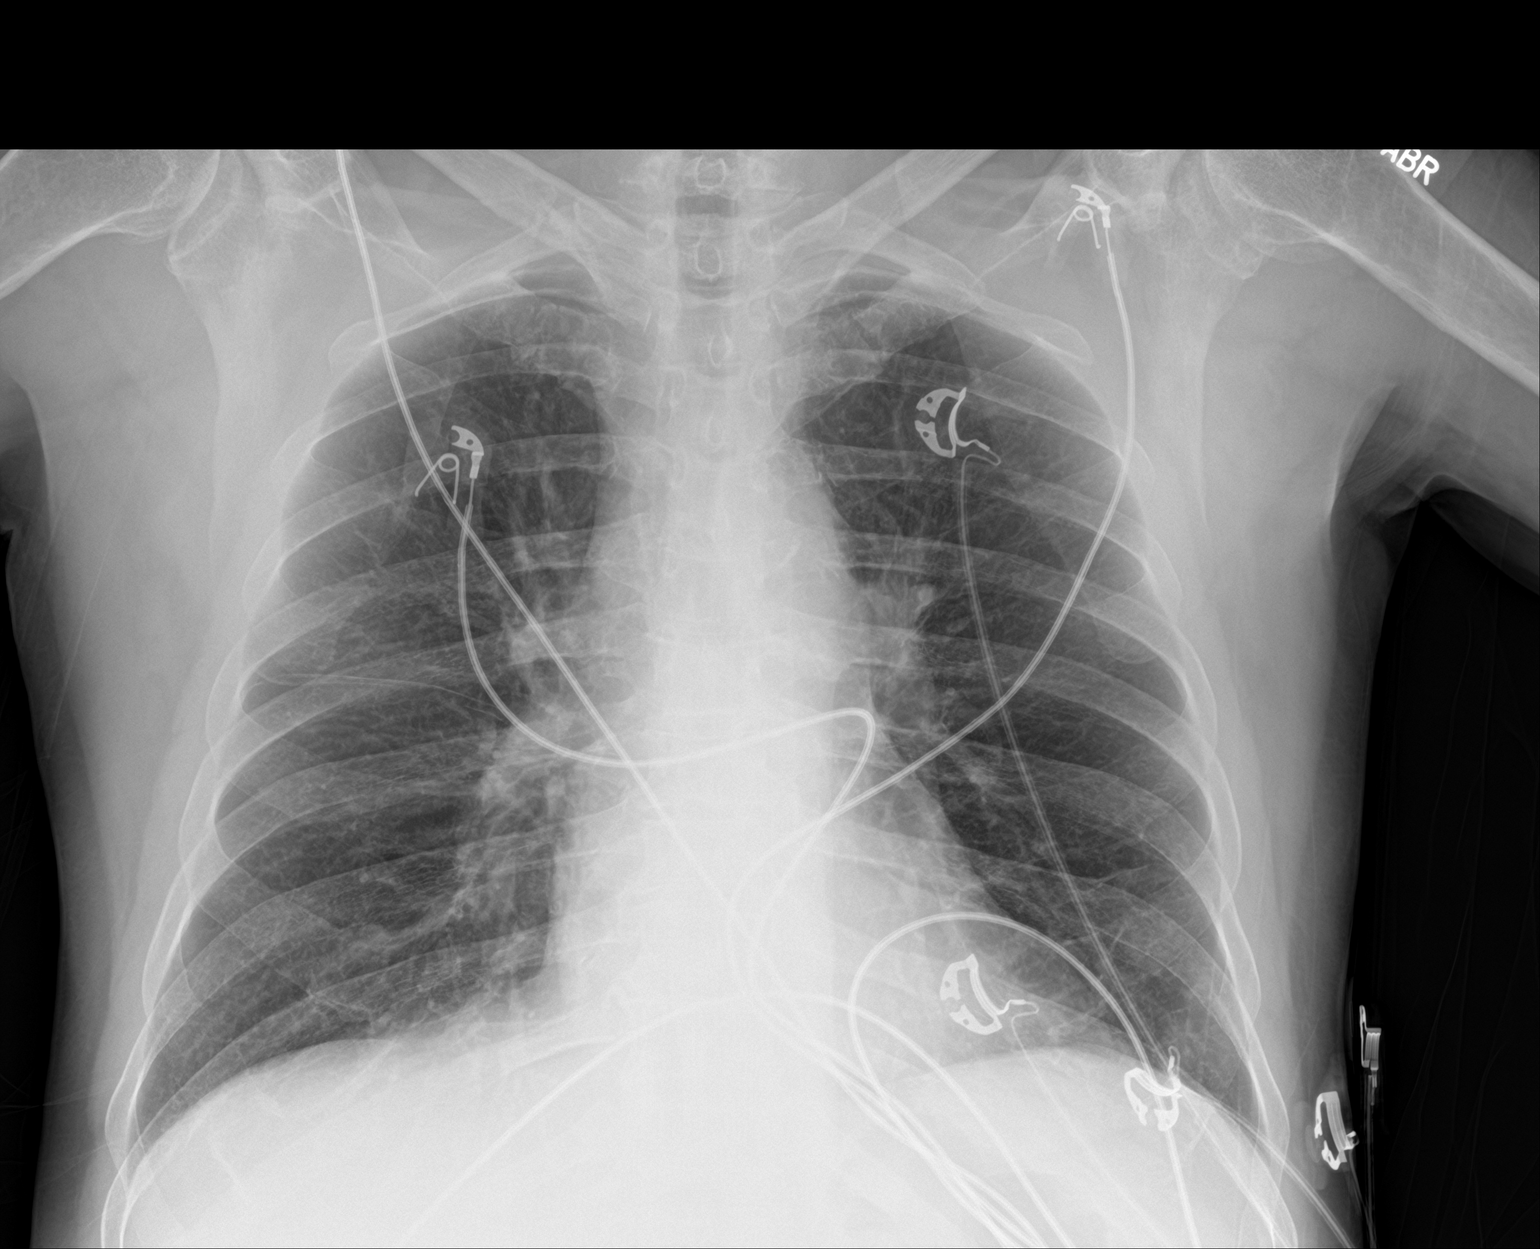

[1 of 1 positions shown; findings below may reference images not displayed]

FINDINGS: Heart and mediastinal contours are within normal limits. No focal
opacities or effusions. No acute bony abnormality.
IMPRESSION: No active disease.

## 2021-10-27 IMAGING — US US ABDOMEN LIMITED
1 series · 14 of 25 positions shown · non-contrast
Comparison: CT abdomen pelvis, 10/15/2019

CLINICAL DATA: Acute cholecystitis, abdominal distension

EXAM:
ULTRASOUND ABDOMEN LIMITED RIGHT UPPER QUADRANT

[Series 1: us abdomen limited ruq · 14 of 44 slices shown]
[im 1/44]
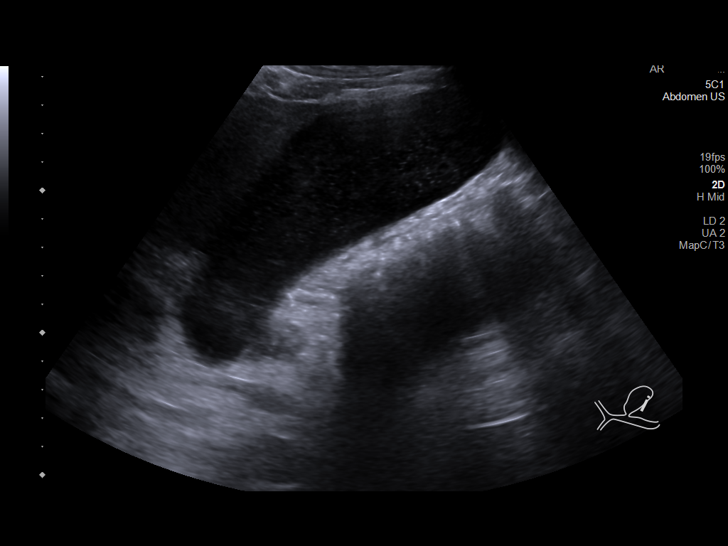
[im 4/44]
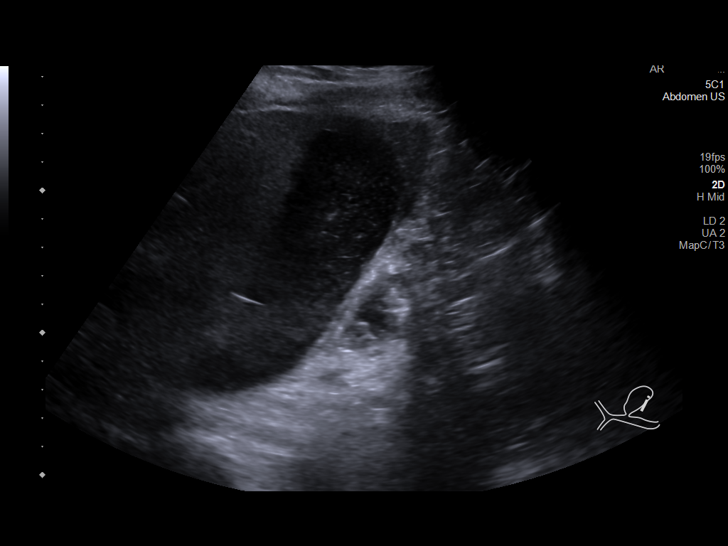
[im 8/44]
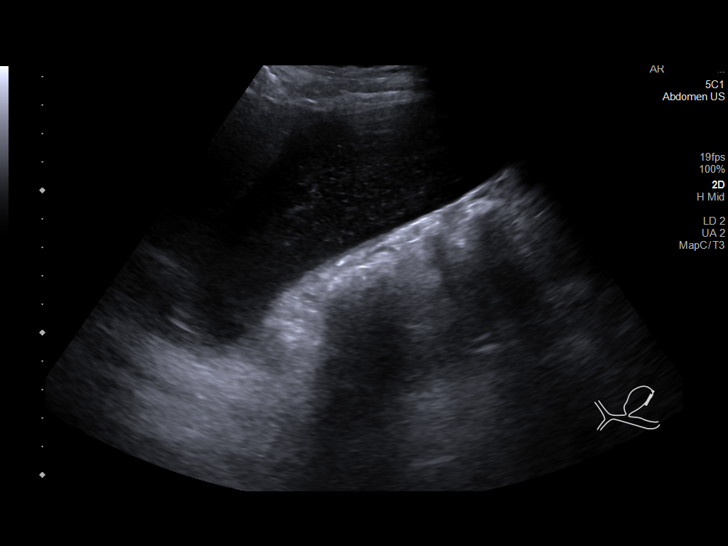
[im 11/44]
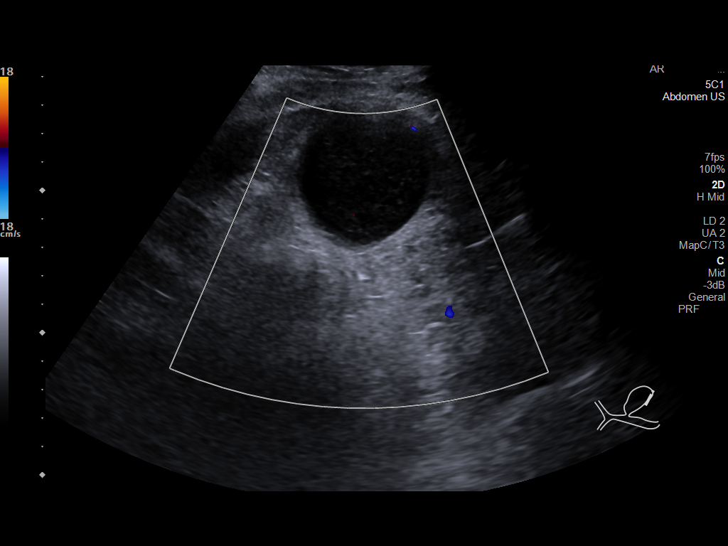
[im 15/44]
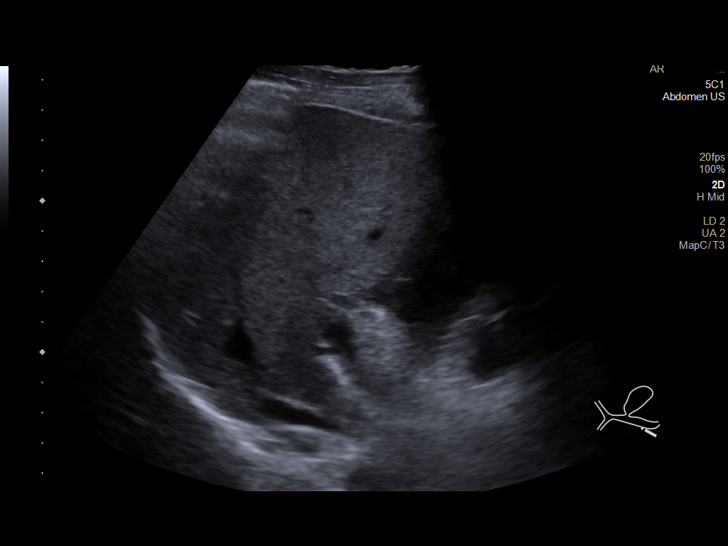
[im 17/44]
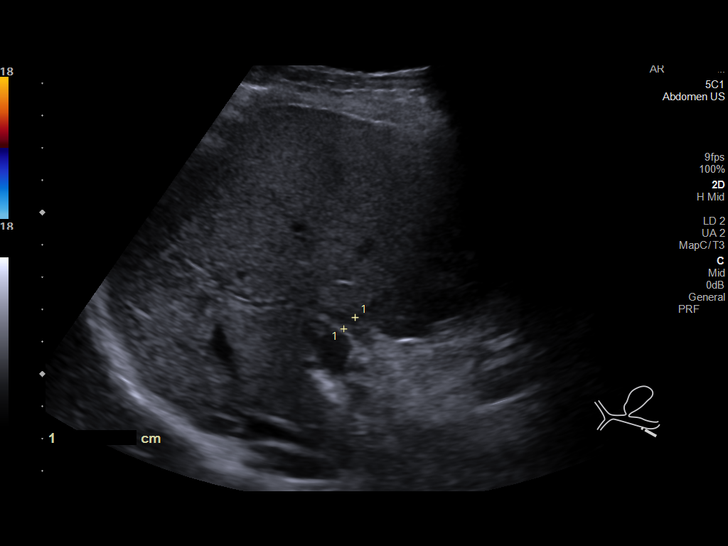
[im 20/44]
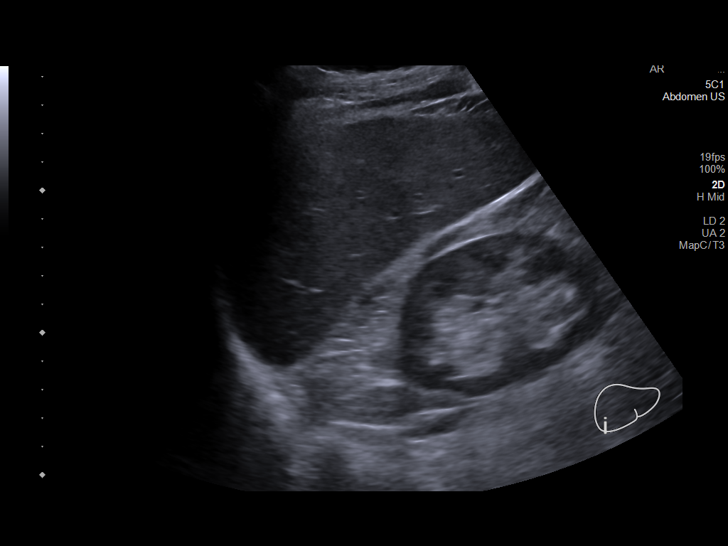
[im 24/44]
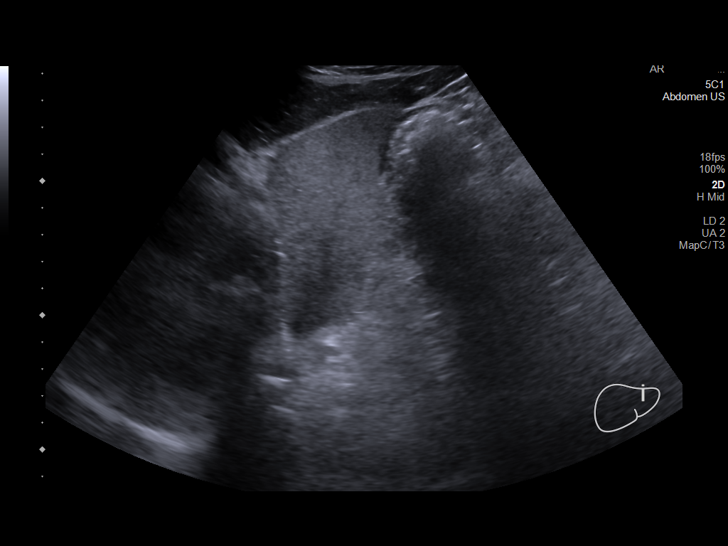
[im 27/44]
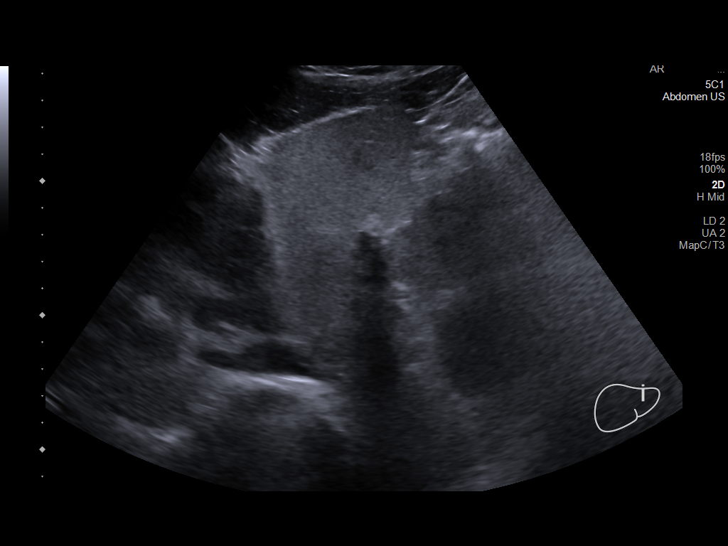
[im 29/44]
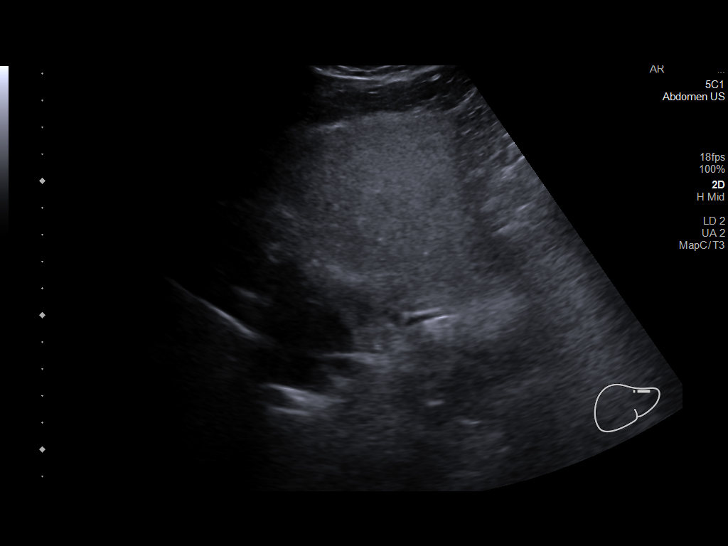
[im 33/44]
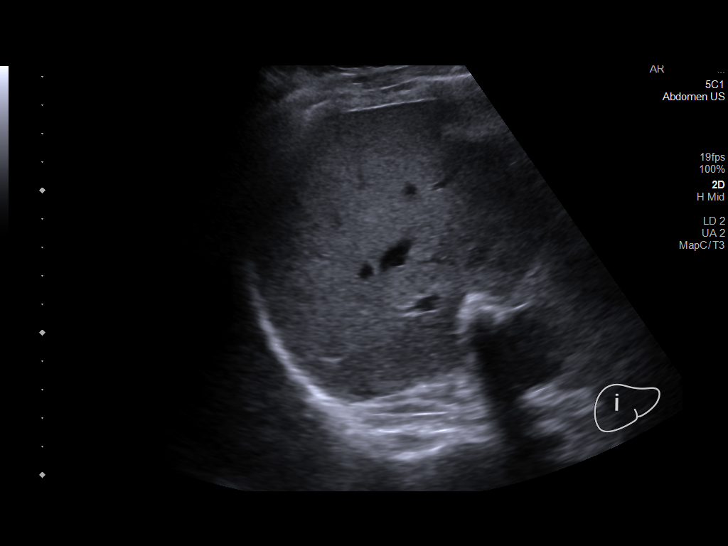
[im 36/44]
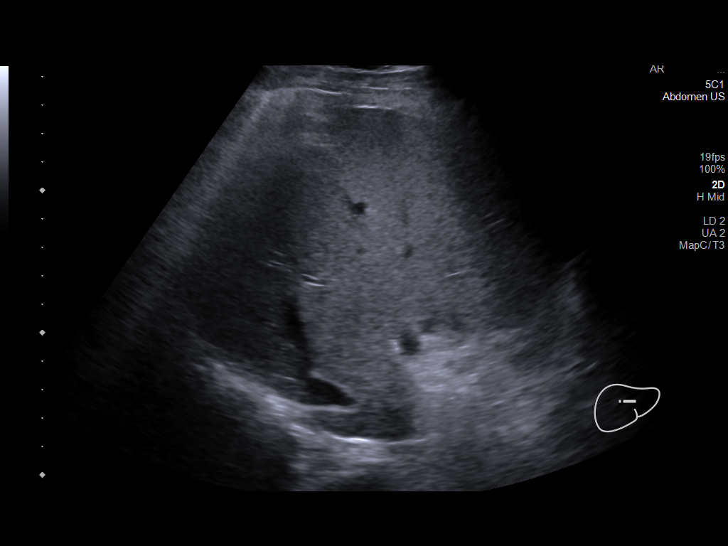
[im 40/44]
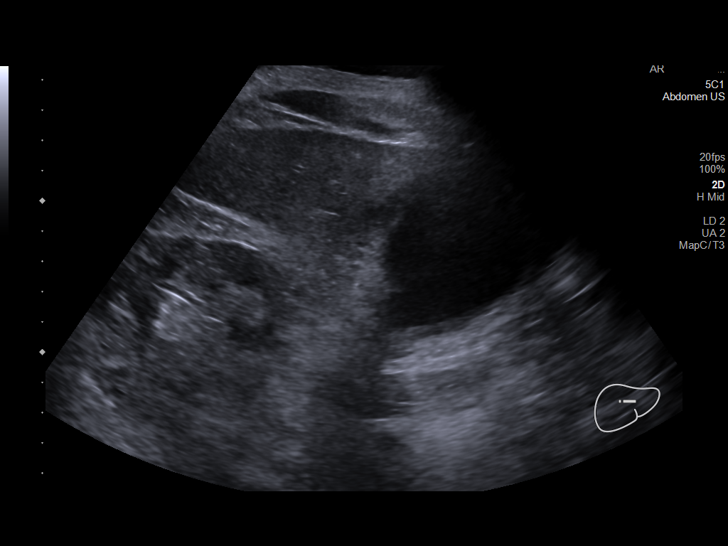
[im 44/44]
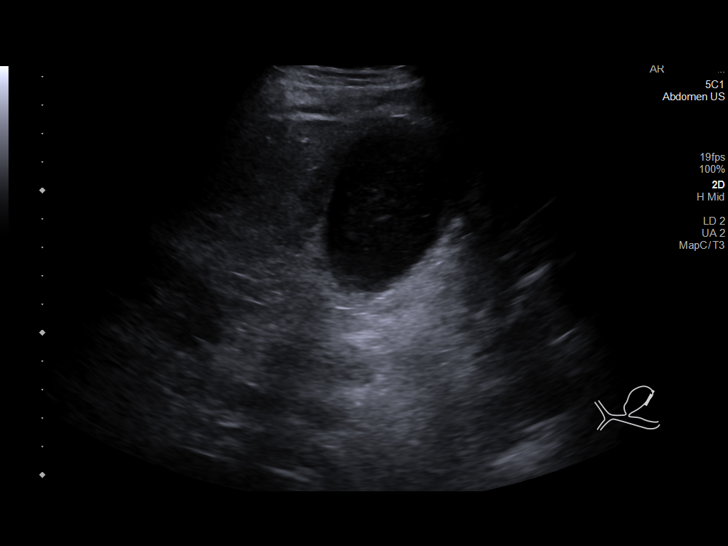

[14 of 25 positions shown; findings below may reference images not displayed]

FINDINGS: Gallbladder:

The gallbladder is distended, maximum span 14.3 cm, and filled with
echogenic sludge. No gallstones or wall thickening visualized.
Positive sonographic Murphy sign noted by sonographer.

Common bile duct:

Diameter: 5 mm

Liver:

No focal lesion identified. Increased parenchymal echogenicity.
Portal vein is patent on color Doppler imaging with normal direction
of blood flow towards the liver.

Other: None.
IMPRESSION: 1. The gallbladder is distended, maximum span 14.3 cm, and filled
with echogenic sludge. No discrete calculi are identified. No
gallbladder wall thickening. No biliary ductal dilatation. Positive
sonographic Murphy sign. Findings are concerning for acute
cholecystitis. Patency of the cystic and common bile ducts may be
evaluated by HIDA if desired.

2.  Hepatic steatosis.

## 2021-12-02 ENCOUNTER — Other Ambulatory Visit: Payer: Self-pay | Admitting: Family Medicine

## 2021-12-06 ENCOUNTER — Ambulatory Visit (INDEPENDENT_AMBULATORY_CARE_PROVIDER_SITE_OTHER): Payer: Medicare HMO | Admitting: Family Medicine

## 2021-12-06 ENCOUNTER — Ambulatory Visit (INDEPENDENT_AMBULATORY_CARE_PROVIDER_SITE_OTHER): Payer: Medicare HMO

## 2021-12-06 VITALS — BP 100/62 | HR 82 | Temp 98.3°F | Ht 65.0 in | Wt 158.4 lb

## 2021-12-06 DIAGNOSIS — L309 Dermatitis, unspecified: Secondary | ICD-10-CM | POA: Diagnosis not present

## 2021-12-06 DIAGNOSIS — R059 Cough, unspecified: Secondary | ICD-10-CM | POA: Diagnosis not present

## 2021-12-06 DIAGNOSIS — J432 Centrilobular emphysema: Secondary | ICD-10-CM | POA: Diagnosis not present

## 2021-12-06 DIAGNOSIS — Z0000 Encounter for general adult medical examination without abnormal findings: Secondary | ICD-10-CM | POA: Diagnosis not present

## 2021-12-06 DIAGNOSIS — Z0001 Encounter for general adult medical examination with abnormal findings: Secondary | ICD-10-CM | POA: Diagnosis not present

## 2021-12-06 DIAGNOSIS — Z136 Encounter for screening for cardiovascular disorders: Secondary | ICD-10-CM | POA: Diagnosis not present

## 2021-12-06 DIAGNOSIS — R0602 Shortness of breath: Secondary | ICD-10-CM | POA: Diagnosis not present

## 2021-12-06 MED ORDER — TRIAMCINOLONE ACETONIDE 0.1 % EX CREA: 1.0000 "application " | TOPICAL_CREAM | Freq: Three times a day (TID) | CUTANEOUS | 0 refills | Status: DC

## 2021-12-06 MED ORDER — FLUTICASONE FUROATE-VILANTEROL 100-25 MCG/ACT IN AEPB
INHALATION_SPRAY | RESPIRATORY_TRACT | 3 refills | Status: DC
Start: 2021-12-06 — End: 2022-12-08

## 2021-12-06 NOTE — Progress Notes (Signed)
Subjective:  Patient ID: Gordon Navarro, male    DOB: 10-20-1950  Age: 71 y.o. MRN: [id number]  CC: Annual Exam   HPI Gordon Navarro presents for CPE Drinks coke and coffee. Doesn't drink water.    12/06/2021    1:13 PM 06/07/2021    1:40 PM 12/10/2020    4:16 PM  Depression screen PHQ 2/9  Decreased Interest 0 0 0  Down, Depressed, Hopeless 0 0 0  PHQ - 2 Score 0 0 0  Altered sleeping   1  Tired, decreased energy   1  Change in appetite   0  Feeling bad or failure about yourself    0  Trouble concentrating   0  Moving slowly or fidgety/restless   0  Suicidal thoughts   0  PHQ-9 Score   2  Difficult doing work/chores   Not difficult at all    History Gordon Navarro has a past medical history of Emphysema of lung (Gordon Navarro).   He has a past surgical history that includes Skin graft (1985) and Cholecystectomy (N/A, 10/18/2019).   His family history includes Heart attack in his father.He reports that he has quit smoking. He has never used smokeless tobacco. He reports current alcohol use. He reports that he does not use drugs.    ROS Review of Systems  Constitutional:  Negative for activity change, fatigue and unexpected weight change.  HENT:  Negative for congestion, ear pain, hearing loss, postnasal drip and trouble swallowing.   Eyes:  Negative for pain and visual disturbance.  Respiratory:  Negative for cough, chest tightness and shortness of breath.   Cardiovascular:  Negative for chest pain, palpitations and leg swelling.  Gastrointestinal:  Negative for abdominal distention, abdominal pain, blood in stool, constipation, diarrhea, nausea and vomiting.  Endocrine: Negative for cold intolerance, heat intolerance and polydipsia.  Genitourinary:  Negative for difficulty urinating, dysuria, flank pain, frequency and urgency.  Musculoskeletal:  Negative for arthralgias and joint swelling.  Skin:  Negative for color change, rash (pruritic small red bumps on upper chest) and wound.   Neurological:  Negative for dizziness, syncope, speech difficulty, weakness, light-headedness, numbness and headaches.  Hematological:  Does not bruise/bleed easily.  Psychiatric/Behavioral:  Negative for confusion, decreased concentration, dysphoric mood and sleep disturbance. The patient is not nervous/anxious.     Objective:  BP 100/62   Pulse 82   Temp 98.3 F (36.8 C)   Ht 5' 5"  (1.651 m)   Wt 158 lb 6.4 oz (71.8 kg)   SpO2 93%   BMI 26.36 kg/m   BP Readings from Last 3 Encounters:  12/06/21 100/62  06/07/21 (!) 96/57  12/04/20 111/62    Wt Readings from Last 3 Encounters:  12/06/21 158 lb 6.4 oz (71.8 kg)  06/07/21 162 lb 6.4 oz (73.7 kg)  12/10/20 164 lb (74.4 kg)     Physical Exam Constitutional:      Appearance: He is well-developed.  HENT:     Head: Normocephalic and atraumatic.  Eyes:     Pupils: Pupils are equal, round, and reactive to light.  Neck:     Thyroid: No thyromegaly.     Trachea: No tracheal deviation.  Cardiovascular:     Rate and Rhythm: Normal rate and regular rhythm.     Heart sounds: Normal heart sounds. No murmur heard.    No friction rub. No gallop.  Pulmonary:     Breath sounds: Normal breath sounds. No wheezing or rales.  Abdominal:  General: Bowel sounds are normal. There is no distension.     Palpations: Abdomen is soft. There is no mass.     Tenderness: There is no abdominal tenderness.     Hernia: There is no hernia in the left inguinal area.  Genitourinary:    Penis: Normal.      Testes: Normal.  Musculoskeletal:        General: Normal range of motion.     Cervical back: Normal range of motion.  Lymphadenopathy:     Cervical: No cervical adenopathy.  Skin:    General: Skin is warm and dry.  Neurological:     Mental Status: He is alert and oriented to person, place, and time.       Assessment & Plan:   Gordon Navarro was seen today for annual exam.  Diagnoses and all orders for this visit:  Centrilobular  emphysema ([place]) -     CBC with Differential/Platelet -     CMP14+EGFR -     Lipid panel -     DG Chest 2 View; Future  Well adult exam -     CBC with Differential/Platelet -     CMP14+EGFR -     Lipid panel  Eczema, unspecified type  Other orders -     triamcinolone cream (KENALOG) 0.1 %; Apply 1 application  topically 3 (three) times daily. Avoid face and genitalia -     fluticasone furoate-vilanterol (BREO ELLIPTA) 100-25 MCG/ACT AEPB; Inhale 1 puff by mouth once daily       I have changed Gordon Navarro's Breo Ellipta to fluticasone furoate-vilanterol. I am also having him start on triamcinolone cream.  Allergies as of 12/06/2021   No Known Allergies      Medication List        Accurate as of December 06, 2021  1:48 PM. If you have any questions, ask your nurse or doctor.          fluticasone furoate-vilanterol 100-25 MCG/ACT Aepb Commonly known as: Breo Ellipta Inhale 1 puff by mouth once daily   triamcinolone cream 0.1 % Commonly known as: KENALOG Apply 1 application  topically 3 (three) times daily. Avoid face and genitalia         Follow-up: No follow-ups on file.  Claretta Fraise, M.D.

## 2021-12-07 LAB — CBC WITH DIFFERENTIAL/PLATELET
Basophils Absolute: 0.1 10*3/uL (ref 0.0–0.2)
Basos: 1 %
EOS (ABSOLUTE): 0.3 10*3/uL (ref 0.0–0.4)
Eos: 4 %
Hematocrit: 45.7 % (ref 37.5–51.0)
Hemoglobin: 14.9 g/dL (ref 13.0–17.7)
Immature Grans (Abs): 0 10*3/uL (ref 0.0–0.1)
Immature Granulocytes: 0 %
Lymphocytes Absolute: 2 10*3/uL (ref 0.7–3.1)
Lymphs: 23 %
MCH: 28.8 pg (ref 26.6–33.0)
MCHC: 32.6 g/dL (ref 31.5–35.7)
MCV: 88 fL (ref 79–97)
Monocytes Absolute: 0.6 10*3/uL (ref 0.1–0.9)
Monocytes: 7 %
Neutrophils Absolute: 5.8 10*3/uL (ref 1.4–7.0)
Neutrophils: 65 %
Platelets: 196 10*3/uL (ref 150–450)
RBC: 5.17 x10E6/uL (ref 4.14–5.80)
RDW: 13.7 % (ref 11.6–15.4)
WBC: 8.8 10*3/uL (ref 3.4–10.8)

## 2021-12-07 LAB — CMP14+EGFR
ALT: 26 IU/L (ref 0–44)
AST: 21 IU/L (ref 0–40)
Albumin/Globulin Ratio: 1.3 (ref 1.2–2.2)
Albumin: 4.3 g/dL (ref 3.8–4.8)
Alkaline Phosphatase: 104 IU/L (ref 44–121)
BUN/Creatinine Ratio: 19 (ref 10–24)
BUN: 22 mg/dL (ref 8–27)
Bilirubin Total: 0.2 mg/dL (ref 0.0–1.2)
CO2: 24 mmol/L (ref 20–29)
Calcium: 8.9 mg/dL (ref 8.6–10.2)
Chloride: 109 mmol/L — ABNORMAL HIGH (ref 96–106)
Creatinine, Ser: 1.16 mg/dL (ref 0.76–1.27)
Globulin, Total: 3.2 g/dL (ref 1.5–4.5)
Glucose: 102 mg/dL — ABNORMAL HIGH (ref 70–99)
Potassium: 4.6 mmol/L (ref 3.5–5.2)
Sodium: 145 mmol/L — ABNORMAL HIGH (ref 134–144)
Total Protein: 7.5 g/dL (ref 6.0–8.5)
eGFR: 69 mL/min/{1.73_m2} (ref >59)

## 2021-12-07 LAB — LIPID PANEL
Chol/HDL Ratio: 3.4 ratio (ref 0.0–5.0)
Cholesterol, Total: 143 mg/dL (ref 100–199)
HDL: 42 mg/dL (ref >39)
LDL Chol Calc (NIH): 87 mg/dL (ref 0–99)
Triglycerides: 71 mg/dL (ref 0–149)
VLDL Cholesterol Cal: 14 mg/dL (ref 5–40)

## 2021-12-07 NOTE — Progress Notes (Signed)
Your chest x-ray looked normal. Thanks, WS.

## 2021-12-07 NOTE — Progress Notes (Signed)
Hello Gordon Navarro,  Your lab result is normal and/or stable.Some minor variations that are not significant are commonly marked abnormal, but do not represent any medical problem for you.  Best regards, [name], M.D.

## 2021-12-09 ENCOUNTER — Telehealth: Payer: Self-pay | Admitting: Family Medicine

## 2021-12-09 NOTE — Telephone Encounter (Signed)
Left message for patient to call back and schedule Medicare Annual Wellness Visit (AWV) to be completed by video or phone.   Last AWV: 12/10/2020  Please schedule at anytime with [facility] Nurse Health Advisor     45 minute appointment  Any questions, please contact me at [phone]

## 2022-01-02 ENCOUNTER — Other Ambulatory Visit: Payer: Self-pay | Admitting: Family Medicine

## 2022-02-03 ENCOUNTER — Ambulatory Visit (INDEPENDENT_AMBULATORY_CARE_PROVIDER_SITE_OTHER): Payer: Medicare HMO

## 2022-02-03 VITALS — Wt 158.0 lb

## 2022-02-03 DIAGNOSIS — Z0000 Encounter for general adult medical examination without abnormal findings: Secondary | ICD-10-CM | POA: Diagnosis not present
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
## 2022-02-03 NOTE — Patient Instructions (Signed)
Mr. Gordon Navarro , Thank you for taking time to come for your Medicare Wellness Visit. I appreciate your ongoing commitment to your health goals. Please review the following plan we discussed and let me know if I can assist you in the future.   Screening recommendations/referrals: Colonoscopy: Return Cologuard test soon Recommended yearly ophthalmology/optometry visit for glaucoma screening and checkup Recommended yearly dental visit for hygiene and checkup  Vaccinations: Influenza vaccine: Due soon- recommended every fall Pneumococcal vaccine:  Done  08/28/2019 & 12/04/2020 Tdap vaccine: Due - recommended every 10 years Shingles vaccine: Done   12/04/2020 & 06/07/2021 Covid-19: Done  03/11/2020 & 04/08/2020  Advanced directives: Advance directive discussed with you today. Even though you declined this today, please call our office should you change your mind, and we can give you the proper paperwork for you to fill out.   Conditions/risks identified: Aim for 30 minutes of exercise or brisk walking, 6-8 glasses of water, and 5 servings of fruits and vegetables each day.   Next appointment: Follow up in one year for your annual wellness visit.   Preventive Care 15 Years and Older, Male  Preventive care refers to lifestyle choices and visits with your health care provider that can promote health and wellness. What does preventive care include? A yearly physical exam. This is also called an annual well check. Dental exams once or twice a year. Routine eye exams. Ask your health care provider how often you should have your eyes checked. Personal lifestyle choices, including: Daily care of your teeth and gums. Regular physical activity. Eating a healthy diet. Avoiding tobacco and drug use. Limiting alcohol use. Practicing safe sex. Taking low doses of aspirin every day. Taking vitamin and mineral supplements as recommended by your health care provider. What happens during an annual well  check? The services and screenings done by your health care provider during your annual well check will depend on your age, overall health, lifestyle risk factors, and family history of disease. Counseling  Your health care provider may ask you questions about your: Alcohol use. Tobacco use. Drug use. Emotional well-being. Home and relationship well-being. Sexual activity. Eating habits. History of falls. Memory and ability to understand (cognition). Work and work Astronomer. Screening  You may have the following tests or measurements: Height, weight, and BMI. Blood pressure. Lipid and cholesterol levels. These may be checked every 5 years, or more frequently if you are over 94 years old. Skin check. Lung cancer screening. You may have this screening every year starting at age 15 if you have a 30-pack-year history of smoking and currently smoke or have quit within the past 15 years. Fecal occult blood test (FOBT) of the stool. You may have this test every year starting at age 2. Flexible sigmoidoscopy or colonoscopy. You may have a sigmoidoscopy every 5 years or a colonoscopy every 10 years starting at age 60. Prostate cancer screening. Recommendations will vary depending on your family history and other risks. Hepatitis C blood test. Hepatitis B blood test. Sexually transmitted disease (STD) testing. Diabetes screening. This is done by checking your blood sugar (glucose) after you have not eaten for a while (fasting). You may have this done every 1-3 years. Abdominal aortic aneurysm (AAA) screening. You may need this if you are a current or former smoker. Osteoporosis. You may be screened starting at age 5 if you are at high risk. Talk with your health care provider about your test results, treatment options, and if necessary, the need for more  tests. Vaccines  Your health care provider may recommend certain vaccines, such as: Influenza vaccine. This is recommended every  year. Tetanus, diphtheria, and acellular pertussis (Tdap, Td) vaccine. You may need a Td booster every 10 years. Zoster vaccine. You may need this after age 76. Pneumococcal 13-valent conjugate (PCV13) vaccine. One dose is recommended after age 37. Pneumococcal polysaccharide (PPSV23) vaccine. One dose is recommended after age 27. Talk to your health care provider about which screenings and vaccines you need and how often you need them. This information is not intended to replace advice given to you by your health care provider. Make sure you discuss any questions you have with your health care provider. Document Released: 07/10/2015 Document Revised: 03/02/2016 Document Reviewed: 04/14/2015 Elsevier Interactive Patient Education  2017 [place] Prevention in the Home Falls can cause injuries. They can happen to people of all ages. There are many things you can do to make your home safe and to help prevent falls. What can I do on the outside of my home? Regularly fix the edges of walkways and driveways and fix any cracks. Remove anything that might make you trip as you walk through a door, such as a raised step or threshold. Trim any bushes or trees on the path to your home. Use bright outdoor lighting. Clear any walking paths of anything that might make someone trip, such as rocks or tools. Regularly check to see if handrails are loose or broken. Make sure that both sides of any steps have handrails. Any raised decks and porches should have guardrails on the edges. Have any leaves, snow, or ice cleared regularly. Use sand or salt on walking paths during winter. Clean up any spills in your garage right away. This includes oil or grease spills. What can I do in the bathroom? Use night lights. Install grab bars by the toilet and in the tub and shower. Do not use towel bars as grab bars. Use non-skid mats or decals in the tub or shower. If you need to sit down in the shower, use a  plastic, non-slip stool. Keep the floor dry. Clean up any water that spills on the floor as soon as it happens. Remove soap buildup in the tub or shower regularly. Attach bath mats securely with double-sided non-slip rug tape. Do not have throw rugs and other things on the floor that can make you trip. What can I do in the bedroom? Use night lights. Make sure that you have a light by your bed that is easy to reach. Do not use any sheets or blankets that are too big for your bed. They should not hang down onto the floor. Have a firm chair that has side arms. You can use this for support while you get dressed. Do not have throw rugs and other things on the floor that can make you trip. What can I do in the kitchen? Clean up any spills right away. Avoid walking on wet floors. Keep items that you use a lot in easy-to-reach places. If you need to reach something above you, use a strong step stool that has a grab bar. Keep electrical cords out of the way. Do not use floor polish or wax that makes floors slippery. If you must use wax, use non-skid floor wax. Do not have throw rugs and other things on the floor that can make you trip. What can I do with my stairs? Do not leave any items on the stairs. Make sure  that there are handrails on both sides of the stairs and use them. Fix handrails that are broken or loose. Make sure that handrails are as long as the stairways. Check any carpeting to make sure that it is firmly attached to the stairs. Fix any carpet that is loose or worn. Avoid having throw rugs at the top or bottom of the stairs. If you do have throw rugs, attach them to the floor with carpet tape. Make sure that you have a light switch at the top of the stairs and the bottom of the stairs. If you do not have them, ask someone to add them for you. What else can I do to help prevent falls? Wear shoes that: Do not have high heels. Have rubber bottoms. Are comfortable and fit you  well. Are closed at the toe. Do not wear sandals. If you use a stepladder: Make sure that it is fully opened. Do not climb a closed stepladder. Make sure that both sides of the stepladder are locked into place. Ask someone to hold it for you, if possible. Clearly mark and make sure that you can see: Any grab bars or handrails. First and last steps. Where the edge of each step is. Use tools that help you move around (mobility aids) if they are needed. These include: Canes. Walkers. Scooters. Crutches. Turn on the lights when you go into a dark area. Replace any light bulbs as soon as they burn out. Set up your furniture so you have a clear path. Avoid moving your furniture around. If any of your floors are uneven, fix them. If there are any pets around you, be aware of where they are. Review your medicines with your doctor. Some medicines can make you feel dizzy. This can increase your chance of falling. Ask your doctor what other things that you can do to help prevent falls. This information is not intended to replace advice given to you by your health care provider. Make sure you discuss any questions you have with your health care provider. Document Released: 04/09/2009 Document Revised: 11/19/2015 Document Reviewed: 07/18/2014 Elsevier Interactive Patient Education  2017 [employer].

## 2022-12-08 ENCOUNTER — Ambulatory Visit (INDEPENDENT_AMBULATORY_CARE_PROVIDER_SITE_OTHER): Payer: Medicare HMO | Admitting: Family Medicine

## 2022-12-08 ENCOUNTER — Encounter: Payer: Self-pay | Admitting: Family Medicine

## 2022-12-08 VITALS — BP 91/53 | HR 87 | Temp 97.5°F | Ht 65.0 in | Wt 149.4 lb

## 2022-12-08 DIAGNOSIS — Z0000 Encounter for general adult medical examination without abnormal findings: Secondary | ICD-10-CM

## 2022-12-08 DIAGNOSIS — Z0001 Encounter for general adult medical examination with abnormal findings: Secondary | ICD-10-CM | POA: Diagnosis not present

## 2022-12-08 DIAGNOSIS — Z13220 Encounter for screening for lipoid disorders: Secondary | ICD-10-CM | POA: Diagnosis not present

## 2022-12-08 DIAGNOSIS — Z1211 Encounter for screening for malignant neoplasm of colon: Secondary | ICD-10-CM | POA: Diagnosis not present

## 2022-12-08 DIAGNOSIS — F17200 Nicotine dependence, unspecified, uncomplicated: Secondary | ICD-10-CM | POA: Diagnosis not present

## 2022-12-08 DIAGNOSIS — E559 Vitamin D deficiency, unspecified: Secondary | ICD-10-CM

## 2022-12-08 DIAGNOSIS — Z125 Encounter for screening for malignant neoplasm of prostate: Secondary | ICD-10-CM

## 2022-12-08 DIAGNOSIS — J432 Centrilobular emphysema: Secondary | ICD-10-CM

## 2022-12-08 DIAGNOSIS — Z122 Encounter for screening for malignant neoplasm of respiratory organs: Secondary | ICD-10-CM

## 2022-12-08 LAB — URINALYSIS
Bilirubin, UA: NEGATIVE
Glucose, UA: NEGATIVE
Ketones, UA: NEGATIVE
Leukocytes,UA: NEGATIVE
Nitrite, UA: NEGATIVE
Protein,UA: NEGATIVE
RBC, UA: NEGATIVE
Specific Gravity, UA: 1.025 (ref 1.005–1.030)
Urobilinogen, Ur: 0.2 mg/dL (ref 0.2–1.0)
pH, UA: 5.5 (ref 5.0–7.5)

## 2022-12-08 MED ORDER — FLUTICASONE FUROATE-VILANTEROL 100-25 MCG/ACT IN AEPB: INHALATION_SPRAY | RESPIRATORY_TRACT | 3 refills | Status: DC

## 2022-12-08 NOTE — Progress Notes (Signed)
Subjective:  Patient ID: Gordon Navarro, male    DOB: [birth year]  Age: 72 y.o. MRN: [id number]  CC: Annual Exam   HPI Gordon Navarro presents for Annual physical. No Concerns     12/08/2022    1:10 PM 02/03/2022   12:05 PM 12/06/2021    1:13 PM  Depression screen PHQ 2/9  Decreased Interest 0 0 0  Down, Depressed, Hopeless 0 0 0  PHQ - 2 Score 0 0 0    History Gordon Navarro has a past medical history of Emphysema of lung (HCC).   He has a past surgical history that includes Skin graft (1985) and Cholecystectomy (N/A, 10/18/2019).   His family history includes Heart attack in his father.He reports that he quit smoking about 9 years ago. His smoking use included cigarettes. He has a 40.00 pack-year smoking history. He has never used smokeless tobacco. He reports current alcohol use. He reports that he does not use drugs.    ROS Review of Systems  Constitutional:  Negative for activity change, fatigue and unexpected weight change.  HENT:  Negative for congestion, ear pain, hearing loss, postnasal drip and trouble swallowing.   Eyes:  Negative for pain and visual disturbance.  Respiratory:  Negative for cough, chest tightness and shortness of breath.   Cardiovascular:  Negative for chest pain, palpitations and leg swelling.  Gastrointestinal:  Negative for abdominal distention, abdominal pain, blood in stool, constipation, diarrhea, nausea and vomiting.  Endocrine: Negative for cold intolerance, heat intolerance and polydipsia.  Genitourinary:  Negative for difficulty urinating, dysuria, flank pain, frequency and urgency.  Musculoskeletal:  Negative for arthralgias and joint swelling.  Skin:  Negative for color change, rash and wound.  Neurological:  Negative for dizziness, syncope, speech difficulty, weakness, light-headedness, numbness and headaches.  Hematological:  Does not bruise/bleed easily.  Psychiatric/Behavioral:  Negative for confusion, decreased concentration, dysphoric mood  and sleep disturbance. The patient is not nervous/anxious.     Objective:  BP (!) 91/53   Pulse 87   Temp (!) 97.5 F (36.4 C)   Ht 5\' 5"  (1.651 m)   Wt 149 lb 6.4 oz (67.8 kg)   SpO2 93%   BMI 24.86 kg/m   BP Readings from Last 3 Encounters:  12/08/22 (!) 91/53  12/06/21 100/62  06/07/21 (!) 96/57    Wt Readings from Last 3 Encounters:  12/08/22 149 lb 6.4 oz (67.8 kg)  02/03/22 158 lb (71.7 kg)  12/06/21 158 lb 6.4 oz (71.8 kg)     Physical Exam Constitutional:      Appearance: He is well-developed.  HENT:     Head: Normocephalic and atraumatic.  Eyes:     Pupils: Pupils are equal, round, and reactive to light.  Neck:     Thyroid: No thyromegaly.     Trachea: No tracheal deviation.  Cardiovascular:     Rate and Rhythm: Normal rate and regular rhythm.     Heart sounds: Normal heart sounds. No murmur heard.    No friction rub. No gallop.  Pulmonary:     Breath sounds: Normal breath sounds. No wheezing or rales.  Abdominal:     General: Bowel sounds are normal. There is no distension.     Palpations: Abdomen is soft. There is no mass.     Tenderness: There is no abdominal tenderness.     Hernia: There is no hernia in the left inguinal area.  Genitourinary:    Penis: Normal.      Testes: Normal.  Musculoskeletal:        General: Normal range of motion.     Cervical back: Normal range of motion.  Lymphadenopathy:     Cervical: No cervical adenopathy.  Skin:    General: Skin is warm and dry.  Neurological:     Mental Status: He is alert and oriented to person, place, and time.       Assessment & Plan:   Gordon Navarro was seen today for annual exam.  Diagnoses and all orders for this visit:  Well adult exam -     CBC with Differential/Platelet -     CMP14+EGFR -     Lipid panel -     PSA, total and free -     VITAMIN D 25 Hydroxy (Vit-D Deficiency, Fractures) -     Urinalysis  Vitamin D deficiency -     VITAMIN D 25 Hydroxy (Vit-D Deficiency,  Fractures)  Screening for prostate cancer -     PSA, total and free  Screening cholesterol level -     Lipid panel  Centrilobular emphysema (HCC)  Encounter for screening colonoscopy for non-high-risk patient -     Ambulatory referral to Gastroenterology  Encounter for screening for malignant neoplasm of lung in current smoker with 30 pack year history or greater -     Ambulatory Referral for Lung Cancer Scre  Other orders -     fluticasone furoate-vilanterol (BREO ELLIPTA) 100-25 MCG/ACT AEPB; Inhale 1 puff by mouth once daily       I am having Gordon Navarro maintain his triamcinolone cream and fluticasone furoate-vilanterol.  Allergies as of 12/08/2022   No Known Allergies      Medication List        Accurate as of December 08, 2022  1:46 PM. If you have any questions, ask your nurse or doctor.          fluticasone furoate-vilanterol 100-25 MCG/ACT Aepb Commonly known as: Breo Ellipta Inhale 1 puff by mouth once daily   triamcinolone cream 0.1 % Commonly known as: KENALOG APPLY CREAM EXTERNALLY TO AFFECTED AREA THREE TIMES DAILY. AVOID FACE AND GENITALIA         Follow-up: Return in about 6 months (around 06/09/2023).  Gordon Navarro, M.D.

## 2022-12-09 LAB — CBC WITH DIFFERENTIAL/PLATELET
Basophils Absolute: 0.1 10*3/uL (ref 0.0–0.2)
Basos: 1 %
EOS (ABSOLUTE): 0.4 10*3/uL (ref 0.0–0.4)
Eos: 5 %
Hematocrit: 46.4 % (ref 37.5–51.0)
Hemoglobin: 15.1 g/dL (ref 13.0–17.7)
Immature Grans (Abs): 0 10*3/uL (ref 0.0–0.1)
Immature Granulocytes: 0 %
Lymphocytes Absolute: 2.6 10*3/uL (ref 0.7–3.1)
Lymphs: 31 %
MCH: 29.6 pg (ref 26.6–33.0)
MCHC: 32.5 g/dL (ref 31.5–35.7)
MCV: 91 fL (ref 79–97)
Monocytes Absolute: 0.7 10*3/uL (ref 0.1–0.9)
Monocytes: 8 %
Neutrophils Absolute: 4.8 10*3/uL (ref 1.4–7.0)
Neutrophils: 55 %
Platelets: 184 10*3/uL (ref 150–450)
RBC: 5.1 x10E6/uL (ref 4.14–5.80)
RDW: 13.4 % (ref 11.6–15.4)
WBC: 8.5 10*3/uL (ref 3.4–10.8)

## 2022-12-09 LAB — CMP14+EGFR
ALT: 19 IU/L (ref 0–44)
AST: 22 IU/L (ref 0–40)
Albumin/Globulin Ratio: 1.4
Albumin: 4 g/dL (ref 3.9–4.9)
Alkaline Phosphatase: 104 IU/L (ref 44–121)
BUN/Creatinine Ratio: 15 (ref 10–24)
BUN: 21 mg/dL (ref 8–27)
Bilirubin Total: 0.3 mg/dL (ref 0.0–1.2)
CO2: 24 mmol/L (ref 20–29)
Calcium: 9 mg/dL (ref 8.6–10.2)
Chloride: 102 mmol/L (ref 96–106)
Creatinine, Ser: 1.41 mg/dL — ABNORMAL HIGH (ref 0.76–1.27)
Globulin, Total: 2.9 g/dL (ref 1.5–4.5)
Glucose: 100 mg/dL — ABNORMAL HIGH (ref 70–99)
Potassium: 4.8 mmol/L (ref 3.5–5.2)
Sodium: 140 mmol/L (ref 134–144)
Total Protein: 6.9 g/dL (ref 6.0–8.5)
eGFR: 54 mL/min/{1.73_m2} — ABNORMAL LOW (ref >59)

## 2022-12-09 LAB — LIPID PANEL
Chol/HDL Ratio: 3.4 ratio (ref 0.0–5.0)
Cholesterol, Total: 167 mg/dL (ref 100–199)
HDL: 49 mg/dL (ref >39)
LDL Chol Calc (NIH): 105 mg/dL — ABNORMAL HIGH (ref 0–99)
Triglycerides: 68 mg/dL (ref 0–149)
VLDL Cholesterol Cal: 13 mg/dL (ref 5–40)

## 2022-12-09 LAB — PSA, TOTAL AND FREE
PSA, Free Pct: 40 %
PSA, Free: 0.8 ng/mL
Prostate Specific Ag, Serum: 2 ng/mL (ref 0.0–4.0)

## 2022-12-09 LAB — VITAMIN D 25 HYDROXY (VIT D DEFICIENCY, FRACTURES): Vit D, 25-Hydroxy: 26.7 ng/mL — ABNORMAL LOW (ref 30.0–100.0)

## 2022-12-12 NOTE — Progress Notes (Signed)
Dear Gordon Navarro, Your Vitamin D is  low. You need a prescription strength supplement I will send that in for you. You also have a mild decline in renal function. Need to drink 64 oz of water daily, minimum Nurse, if at all possible, could you send in a prescription for the patient for vitamin D 50,000 units, 1 po. weekly #13 with 3 refills? Many thanks, WS

## 2022-12-13 ENCOUNTER — Other Ambulatory Visit: Payer: Self-pay | Admitting: *Deleted

## 2022-12-21 ENCOUNTER — Encounter: Payer: Self-pay | Admitting: *Deleted

## 2023-01-20 ENCOUNTER — Telehealth: Payer: Self-pay | Admitting: Internal Medicine

## 2023-01-20 NOTE — Telephone Encounter (Signed)
Received questionnaire and it is in review.

## 2023-01-26 ENCOUNTER — Telehealth: Payer: Self-pay | Admitting: *Deleted

## 2023-01-26 NOTE — Telephone Encounter (Signed)
  Procedure: Colonoscopy  Estimated body mass index is 24.86 kg/m as calculated from the following:   Height as of 12/08/22: 5\' 5"  (1.651 m).   Weight as of 12/08/22: 149 lb 6.4 oz (67.8 kg).   Have you had a colonoscopy before?  no  Do you have family history of colon cancer?  no  Do you have a family history of polyps? no  Previous colonoscopy with polyps removed? no  Do you have a history colorectal cancer?   no  Are you diabetic?  no  Do you have a prosthetic or mechanical heart valve? no  Do you have a pacemaker/defibrillator?   no  Have you had endocarditis/atrial fibrillation?  no  Do you use supplemental oxygen/CPAP?  no  Have you had joint replacement within the last 12 months?  no  Do you tend to be constipated or have to use laxatives?  no   Do you have history of alcohol use? If yes, how much and how often.  no  Do you have history or are you using drugs? If yes, what do are you  using?  no  Have you ever had a stroke/heart attack?  no  Have you ever had a heart or other vascular stent placed,?no  Do you take weight loss medication? no  Do you take any blood-thinning medications such as: (Plavix, aspirin, Coumadin, Aggrenox, Brilinta, Xarelto, Eliquis, Pradaxa, Savaysa or Effient)? no  If yes we need the name, milligram, dosage and who is prescribing doctor:               Current Outpatient Medications  Medication Sig Dispense Refill   fluticasone furoate-vilanterol (BREO ELLIPTA) 100-25 MCG/ACT AEPB Inhale 1 puff by mouth once daily 180 each 3   No current facility-administered medications for this visit.    No Known Allergies

## 2023-02-22 ENCOUNTER — Ambulatory Visit (INDEPENDENT_AMBULATORY_CARE_PROVIDER_SITE_OTHER): Payer: Medicare HMO

## 2023-02-22 VITALS — Ht 65.0 in | Wt 155.0 lb

## 2023-02-22 DIAGNOSIS — Z0000 Encounter for general adult medical examination without abnormal findings: Secondary | ICD-10-CM | POA: Diagnosis not present

## 2023-02-22 DIAGNOSIS — Z0100 Encounter for examination of eyes and vision without abnormal findings: Secondary | ICD-10-CM

## 2023-02-22 DIAGNOSIS — Z1211 Encounter for screening for malignant neoplasm of colon: Secondary | ICD-10-CM

## 2023-02-22 NOTE — Patient Instructions (Signed)
Gordon Navarro , Thank you for taking time to come for your Medicare Wellness Visit. I appreciate your ongoing commitment to your health goals. Please review the following plan we discussed and let me know if I can assist you in the future.   Referrals/Orders/Follow-Ups/Clinician Recommendations: Aim for 30 minutes of exercise or brisk walking, 6-8 glasses of water, and 5 servings of fruits and vegetables each day.   This is a list of the screening recommended for you and due dates:  Health Maintenance  Topic Date Due   DTaP/Tdap/Td vaccine (1 - Tdap) Never done   Colon Cancer Screening  Never done   Screening for Lung Cancer  01/21/2020   COVID-19 Vaccine (3 - 2023-24 season) 02/25/2022   Flu Shot  01/26/2023   Hepatitis C Screening  12/08/2023*   Medicare Annual Wellness Visit  02/22/2024   Pneumonia Vaccine  Completed   Zoster (Shingles) Vaccine  Completed   HPV Vaccine  Aged Out  *Topic was postponed. The date shown is not the original due date.    Advanced directives: (Provided) Advance directive discussed with you today. I have provided a copy for you to complete at home and have notarized. Once this is complete, please bring a copy in to our office so we can scan it into your chart. Information on Advanced Care Planning can be found at [facility] Advance Health Care Directives Advance Health Care Directives ([email or web address]    Next Medicare Annual Wellness Visit scheduled for next year: Yes  insert Preventive Care attachment Insert FALL PREVENTION attachment if needed
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
## 2023-03-12 NOTE — Telephone Encounter (Signed)
Ok to schedule. ASA 2. Use trilyte due to kidney disease

## 2023-03-14 NOTE — Telephone Encounter (Signed)
LMOVM to call back to schedule with Dr. Jena Gauss

## 2023-03-16 MED ORDER — PEG 3350-KCL-NA BICARB-NACL 420 G PO SOLR: 4000.0000 mL | Freq: Once | ORAL | 0 refills | Status: AC

## 2023-03-16 NOTE — Addendum Note (Signed)
Addended by: Armstead Peaks on: 03/16/2023 08:48 AM   Modules accepted: Orders

## 2023-03-16 NOTE — Telephone Encounter (Signed)
Spoke with pt. Scheduled for 10/31. He wanted last appt for the day. Aware will mail instructions. Rx for prep sent to pharmacy  PA approved via cohere. Authorization #[id number], DOS: 04/27/2023 - 06/27/2023

## 2023-03-24 ENCOUNTER — Encounter: Payer: Self-pay | Admitting: *Deleted

## 2023-03-24 ENCOUNTER — Encounter (INDEPENDENT_AMBULATORY_CARE_PROVIDER_SITE_OTHER): Payer: Self-pay | Admitting: *Deleted

## 2023-03-28 ENCOUNTER — Encounter: Payer: Self-pay | Admitting: *Deleted

## 2023-03-28 NOTE — Telephone Encounter (Signed)
Referral completed, [facility] apt letter sent to PCP

## 2023-04-27 ENCOUNTER — Ambulatory Visit (HOSPITAL_BASED_OUTPATIENT_CLINIC_OR_DEPARTMENT_OTHER): Payer: Medicare HMO | Admitting: Anesthesiology

## 2023-04-27 ENCOUNTER — Encounter (HOSPITAL_COMMUNITY)
Admission: RE | Disposition: A | Discharge status: Home / Self Care | Payer: Self-pay | Source: Home / Self Care | Attending: Internal Medicine

## 2023-04-27 ENCOUNTER — Other Ambulatory Visit: Payer: Self-pay

## 2023-04-27 ENCOUNTER — Ambulatory Visit (HOSPITAL_COMMUNITY): Payer: Medicare HMO | Admitting: Anesthesiology

## 2023-04-27 ENCOUNTER — Ambulatory Visit (HOSPITAL_COMMUNITY)
Admission: RE | Admit: 2023-04-27 | Discharge: 2023-04-27 | Disposition: A | Discharge status: Home / Self Care | Payer: Medicare HMO | Attending: Internal Medicine | Admitting: Internal Medicine

## 2023-04-27 ENCOUNTER — Encounter (HOSPITAL_COMMUNITY): Payer: Self-pay | Admitting: Internal Medicine

## 2023-04-27 DIAGNOSIS — Z87891 Personal history of nicotine dependence: Secondary | ICD-10-CM | POA: Insufficient documentation

## 2023-04-27 DIAGNOSIS — D126 Benign neoplasm of colon, unspecified: Secondary | ICD-10-CM | POA: Diagnosis not present

## 2023-04-27 DIAGNOSIS — Z1211 Encounter for screening for malignant neoplasm of colon: Secondary | ICD-10-CM | POA: Diagnosis not present

## 2023-04-27 DIAGNOSIS — J449 Chronic obstructive pulmonary disease, unspecified: Secondary | ICD-10-CM | POA: Insufficient documentation

## 2023-04-27 DIAGNOSIS — D122 Benign neoplasm of ascending colon: Secondary | ICD-10-CM | POA: Diagnosis not present

## 2023-04-27 DIAGNOSIS — K635 Polyp of colon: Secondary | ICD-10-CM | POA: Diagnosis not present

## 2023-04-27 HISTORY — PX: POLYPECTOMY: SHX5525

## 2023-04-27 HISTORY — PX: COLONOSCOPY WITH PROPOFOL: SHX5780

## 2023-04-27 SURGERY — COLONOSCOPY WITH PROPOFOL
Anesthesia: General

## 2023-04-27 MED ORDER — PHENYLEPHRINE HCL (PRESSORS) 10 MG/ML IV SOLN
INTRAVENOUS | Status: DC | PRN
  Administered 2023-04-27 (×4): 80 ug via INTRAVENOUS

## 2023-04-27 MED ORDER — PROPOFOL 500 MG/50ML IV EMUL
INTRAVENOUS | Status: DC | PRN
  Administered 2023-04-27: 75 ug/kg/min via INTRAVENOUS

## 2023-04-27 MED ORDER — PROPOFOL 10 MG/ML IV BOLUS
INTRAVENOUS | Status: DC | PRN
  Administered 2023-04-27: 100 mg via INTRAVENOUS

## 2023-04-27 MED ORDER — SODIUM CHLORIDE 0.9% FLUSH: 10.0000 mL | Freq: Two times a day (BID) | INTRAVENOUS | Status: DC

## 2023-04-27 MED ORDER — LACTATED RINGERS IV SOLN
INTRAVENOUS | Status: DC | PRN
Start: 2023-04-27 — End: 2023-04-27

## 2023-04-27 NOTE — Transfer of Care (Signed)
Immediate Anesthesia Transfer of Care Note  Patient: Gordon Navarro  Procedure(s) Performed: COLONOSCOPY WITH PROPOFOL POLYPECTOMY  Patient Location: Endoscopy Unit  Anesthesia Type:General  Level of Consciousness: drowsy  Airway & Oxygen Therapy: Patient Spontanous Breathing  Post-op Assessment: Report given to RN and Post -op Vital signs reviewed and stable  Post vital signs: Reviewed and stable  Last Vitals:  Vitals Value Taken Time  BP 79/40 04/27/23 1230  Temp 36.6 C 04/27/23 1230  Pulse 61 04/27/23 1230  Resp 13 04/27/23 1230  SpO2 98 % 04/27/23 1230    Last Pain:  Vitals:   04/27/23 1230  TempSrc: Axillary  PainSc:       Patients Stated Pain Goal: 7 (04/27/23 1042)  Complications: No notable events documented.

## 2023-04-27 NOTE — H&P (Signed)
@LOGO @   Primary Care Physician:  Mechele Claude, MD Primary Gastroenterologist:  Dr. Jena Gauss  Pre-Procedure History & Physical: HPI:  Gordon Navarro is a 72 y.o. male here for   Past Medical History:  Diagnosis Date   Emphysema of lung [facility])     Past Surgical History:  Procedure Laterality Date   CHOLECYSTECTOMY N/A 10/18/2019   Procedure: LAPAROSCOPIC CHOLECYSTECTOMY;  Surgeon: Franky Macho, MD;  Location: [facility];  Service: General;  Laterality: N/A;   SKIN GRAFT  1985   Left hand    Prior to Admission medications   Medication Sig Start Date End Date Taking? Authorizing Provider  fluticasone furoate-vilanterol (BREO ELLIPTA) 100-25 MCG/ACT AEPB Inhale 1 puff by mouth once daily 12/08/22  Yes Mechele Claude, MD    Allergies as of 03/16/2023   (No Known Allergies)    Family History  Problem Relation Age of Onset   Heart attack Father    Colon cancer Neg Hx     Social History   Socioeconomic History   Marital status: Legally Separated    Spouse name: Not on file   Number of children: 0   Years of education: Not on file   Highest education level: Not on file  Occupational History   Occupation: retired  Tobacco Use   Smoking status: Former    Current packs/day: 0.00    Average packs/day: 1 pack/day for 40.0 years (40.0 ttl pk-yrs)    Types: Cigarettes    Start date: 06/27/1973    Quit date: 06/27/2013    Years since quitting: 9.8   Smokeless tobacco: Never  Substance and Sexual Activity   Alcohol use: Yes    Comment: 1 ounce of liquor some days   Drug use: Never   Sexual activity: Not Currently  Other Topics Concern   Not on file  Social History Narrative   Lives alone, no children.   Social Determinants of Health   Financial Resource Strain: Low Risk  (02/22/2023)   Overall Financial Resource Strain (CARDIA)    Difficulty of Paying Living Expenses: Not hard at all  Food Insecurity: No Food Insecurity (02/22/2023)   Hunger Vital Sign    Worried About  Running Out of Food in the Last Year: Never true    Ran Out of Food in the Last Year: Never true  Transportation Needs: No Transportation Needs (02/22/2023)   PRAPARE - Administrator, Civil Service (Medical): No    Lack of Transportation (Non-Medical): No  Physical Activity: Insufficiently Active (02/22/2023)   Exercise Vital Sign    Days of Exercise per Week: 3 days    Minutes of Exercise per Session: 30 min  Stress: No Stress Concern Present (02/22/2023)   [employer] of Occupational Health - Occupational Stress Questionnaire    Feeling of Stress : Not at all  Social Connections: Socially Isolated (02/22/2023)   Social Connection and Isolation Panel [NHANES]    Frequency of Communication with Friends and Family: More than three times a week    Frequency of Social Gatherings with Friends and Family: More than three times a week    Attends Religious Services: Never    Database administrator or Organizations: No    Attends Banker Meetings: Never    Marital Status: Divorced  Catering manager Violence: Not At Risk (02/22/2023)   Humiliation, Afraid, Rape, and Kick questionnaire    Fear of Current or Ex-Partner: No    Emotionally Abused: No    Physically  Abused: No    Sexually Abused: No    Review of Systems: See HPI, otherwise negative ROS  Physical Exam: BP (!) 109/97   Pulse 88   Temp 97.9 F (36.6 C) (Oral)   Ht 5\' 5"  (1.651 m)   Wt 72.6 kg   SpO2 95%   BMI 26.63 kg/m  General:   Alert,  Well-developed, well-nourished, pleasant and cooperative in NAD Neck:  Supple; no masses or thyromegaly. No significant cervical adenopathy. Lungs:  Clear throughout to auscultation.   No wheezes, crackles, or rhonchi. No acute distress. Heart:  Regular rate and rhythm; no murmurs, clicks, rubs,  or gallops. Abdomen: Non-distended, normal bowel sounds.  Soft and nontender without appreciable mass or hepatosplenomegaly.  Impression/Plan:    72 year old  gentleman here for his first ever average risk screening colonoscopy.   The risks, benefits, limitations, alternatives and imponderables have been reviewed with the patient. Questions have been answered. All parties are agreeable.        Notice: This dictation was prepared with Dragon dictation along with smaller phrase technology. Any transcriptional errors that result from this process are unintentional and may not be corrected upon review.

## 2023-04-27 NOTE — Anesthesia Preprocedure Evaluation (Signed)
Anesthesia Evaluation  Patient identified by MRN, date of birth, ID band Patient awake    Reviewed: Allergy & Precautions, H&P , NPO status , Patient's Chart, lab work & pertinent test results, reviewed documented beta blocker date and time   Airway Mallampati: II  TM Distance: >3 FB Neck ROM: full    Dental no notable dental hx.    Pulmonary neg pulmonary ROS, COPD, former smoker   Pulmonary exam normal breath sounds clear to auscultation       Cardiovascular Exercise Tolerance: Good negative cardio ROS  Rhythm:regular Rate:Normal     Neuro/Psych negative neurological ROS  negative psych ROS   GI/Hepatic negative GI ROS, Neg liver ROS,,,  Endo/Other  negative endocrine ROS    Renal/GU negative Renal ROS  negative genitourinary   Musculoskeletal   Abdominal   Peds  Hematology negative hematology ROS (+)   Anesthesia Other Findings   Reproductive/Obstetrics negative OB ROS                             Anesthesia Physical Anesthesia Plan  ASA: 2  Anesthesia Plan: General   Post-op Pain Management:    Induction:   PONV Risk Score and Plan:   Airway Management Planned:   Additional Equipment:   Intra-op Plan:   Post-operative Plan:   Informed Consent: I have reviewed the patients History and Physical, chart, labs and discussed the procedure including the risks, benefits and alternatives for the proposed anesthesia with the patient or authorized representative who has indicated his/her understanding and acceptance.     Dental Advisory Given  Plan Discussed with: CRNA  Anesthesia Plan Comments:        Anesthesia Quick Evaluation

## 2023-04-27 NOTE — Op Note (Signed)
[facility] Patient Name: Gordon Navarro Procedure Date: 04/27/2023 11:46 AM MRN: [id number] Date of Birth: 10-04-1950 Attending MD: Gennette Pac , MD, [id number] CSN: [id number] Age: 72 Admit Type: Outpatient Procedure:                Colonoscopy Indications:              Screening for colorectal malignant neoplasm Providers:                Gennette Pac, MD, Sheran Fava, Francoise Ceo RN, RN, Pandora Leiter, Technician Referring MD:             Gennette Pac, MD Medicines:                Propofol per Anesthesia Complications:            No immediate complications. Estimated Blood Loss:     Estimated blood loss was minimal. Procedure:                Pre-Anesthesia Assessment:                           - Prior to the procedure, a History and Physical                            was performed, and patient medications and                            allergies were reviewed. The patient's tolerance of                            previous anesthesia was also reviewed. The risks                            and benefits of the procedure and the sedation                            options and risks were discussed with the patient.                            All questions were answered, and informed consent                            was obtained. Prior Anticoagulants: The patient has                            taken no anticoagulant or antiplatelet agents. ASA                            Grade Assessment: II - A patient with mild systemic                            disease. After reviewing the risks and benefits,  the patient was deemed in satisfactory condition to                            undergo the procedure.                           After obtaining informed consent, the colonoscope                            was passed under direct vision. Throughout the                            procedure, the patient's  blood pressure, pulse, and                            oxygen saturations were monitored continuously. The                            [id number]) scope was introduced through the                            anus and advanced to the the cecum, identified by                            appendiceal orifice and ileocecal valve. The                            colonoscopy was performed without difficulty. The                            patient tolerated the procedure well. The quality                            of the bowel preparation was adequate. The                            ileocecal valve, appendiceal orifice, and rectum                            were photographed. The patient tolerated the                            procedure well. The quality of the bowel                            preparation was adequate. Scope In: 12:13:09 PM Scope Out: 12:25:33 PM Scope Withdrawal Time: 0 hours 9 minutes 18 seconds  Total Procedure Duration: 0 hours 12 minutes 24 seconds  Findings:      The perianal and digital rectal examinations were normal.      A 5 mm polyp was found in the ascending colon. The polyp was       semi-pedunculated. The polyp was removed with a cold snare. Resection       and retrieval were complete. Estimated blood loss was minimal.  The exam was otherwise without abnormality on direct and retroflexion       views. Impression:               - One 5 mm polyp in the ascending colon, removed                            with a cold snare. Resected and retrieved.                           - The examination was otherwise normal on direct                            and retroflexion views. Moderate Sedation:      Moderate (conscious) sedation was personally administered by an       anesthesia professional. The following parameters were monitored: oxygen       saturation, heart rate, blood pressure, respiratory rate, EKG, adequacy       of pulmonary ventilation, and response to  care.      Moderate (conscious) sedation was personally administered by an       anesthesia professional. The following parameters were monitored: oxygen       saturation, heart rate, blood pressure, and response to care. Recommendation:           - Patient has a contact number available for                            emergencies. The signs and symptoms of potential                            delayed complications were discussed with the                            patient. Return to normal activities tomorrow.                            Written discharge instructions were provided to the                            patient.                           - Advance diet as tolerated.                           - Continue present medications.                           - Repeat colonoscopy date to be determined after                            pending pathology results are reviewed for                            surveillance.                           -  Return to GI office (date not yet determined). Procedure Code(s):        --- Professional ---                           [phone], Colonoscopy, flexible; with removal of                            tumor(s), polyp(s), or other lesion(s) by snare                            technique Diagnosis Code(s):        --- Professional ---                           Z12.11, Encounter for screening for malignant                            neoplasm of colon                           D12.2, Benign neoplasm of ascending colon CPT copyright 2022 American Medical Association. All rights reserved. The codes documented in this report are preliminary and upon coder review may  be revised to meet current compliance requirements. Gerrit Friends. Temisha Murley, MD Gennette Pac, MD 04/27/2023 12:31:02 PM This report has been signed electronically. Number of Addenda: 0

## 2023-04-27 NOTE — Anesthesia Procedure Notes (Signed)
Date/Time: 04/27/2023 12:08 PM  Performed by: Franco Nones, CRNAPre-anesthesia Checklist: Patient identified, Emergency Drugs available, Suction available, Timeout performed and Patient being monitored Patient Re-evaluated:Patient Re-evaluated prior to induction Oxygen Delivery Method: Non-rebreather mask

## 2023-04-27 NOTE — Discharge Instructions (Addendum)
  Colonoscopy Discharge Instructions  Read the instructions outlined below and refer to this sheet in the next few weeks. These discharge instructions provide you with general information on caring for yourself after you leave the hospital. Your doctor may also give you specific instructions. While your treatment has been planned according to the most current medical practices available, unavoidable complications occasionally occur. If you have any problems or questions after discharge, call Dr. Jena Gauss at [phone]. ACTIVITY You may resume your regular activity, but move at a slower pace for the next 24 hours.  Take frequent rest periods for the next 24 hours.  Walking will help get rid of the air and reduce the bloated feeling in your belly (abdomen).  No driving for 24 hours (because of the medicine (anesthesia) used during the test).   Do not sign any important legal documents or operate any machinery for 24 hours (because of the anesthesia used during the test).  NUTRITION Drink plenty of fluids.  You may resume your normal diet as instructed by your doctor.  Begin with a light meal and progress to your normal diet. Heavy or fried foods are harder to digest and may make you feel sick to your stomach (nauseated).  Avoid alcoholic beverages for 24 hours or as instructed.  MEDICATIONS You may resume your normal medications unless your doctor tells you otherwise.  WHAT YOU CAN EXPECT TODAY Some feelings of bloating in the abdomen.  Passage of more gas than usual.  Spotting of blood in your stool or on the toilet paper.  IF YOU HAD POLYPS REMOVED DURING THE COLONOSCOPY: No aspirin products for 7 days or as instructed.  No alcohol for 7 days or as instructed.  Eat a soft diet for the next 24 hours.  FINDING OUT THE RESULTS OF YOUR TEST Not all test results are available during your visit. If your test results are not back during the visit, make an appointment with your caregiver to find out the  results. Do not assume everything is normal if you have not heard from your caregiver or the medical facility. It is important for you to follow up on all of your test results.  SEEK IMMEDIATE MEDICAL ATTENTION IF: You have more than a spotting of blood in your stool.  Your belly is swollen (abdominal distention).  You are nauseated or vomiting.  You have a temperature over 101.  You have abdominal pain or discomfort that is severe or gets worse throughout the day.     1 polyp removed from your colon today  Further recommendations to follow pending review of pathology report  At patient request, called Gordon Navarro at [phone]-discussed findings and recommendations

## 2023-04-28 LAB — SURGICAL PATHOLOGY

## 2023-04-30 ENCOUNTER — Encounter: Payer: Self-pay | Admitting: Internal Medicine

## 2023-05-02 ENCOUNTER — Encounter (HOSPITAL_COMMUNITY): Payer: Self-pay | Admitting: Internal Medicine

## 2023-05-02 NOTE — Anesthesia Postprocedure Evaluation (Signed)
Anesthesia Post Note  Patient: Gordon Navarro  Procedure(s) Performed: COLONOSCOPY WITH PROPOFOL POLYPECTOMY  Patient location during evaluation: Phase II Anesthesia Type: General Level of consciousness: awake Pain management: pain level controlled Vital Signs Assessment: post-procedure vital signs reviewed and stable Respiratory status: spontaneous breathing and respiratory function stable Cardiovascular status: blood pressure returned to baseline and stable Postop Assessment: no headache and no apparent nausea or vomiting Anesthetic complications: no Comments: Late entry   No notable events documented.   Last Vitals:  Vitals:   04/27/23 1230 04/27/23 1234  BP: (!) 79/40 (!) 91/46  Pulse: 61   Resp: 13 17  Temp: 36.6 C   SpO2: 98%     Last Pain:  Vitals:   04/27/23 1234  TempSrc:   PainSc: 0-No pain                 Windell Norfolk

## 2023-06-29 ENCOUNTER — Other Ambulatory Visit: Payer: Self-pay

## 2023-06-29 DIAGNOSIS — Z87891 Personal history of nicotine dependence: Secondary | ICD-10-CM

## 2023-06-29 DIAGNOSIS — Z122 Encounter for screening for malignant neoplasm of respiratory organs: Secondary | ICD-10-CM

## 2023-07-05 ENCOUNTER — Ambulatory Visit: Payer: Medicare HMO | Admitting: Adult Health

## 2023-07-05 ENCOUNTER — Encounter: Payer: Self-pay | Admitting: Adult Health

## 2023-07-05 DIAGNOSIS — Z87891 Personal history of nicotine dependence: Secondary | ICD-10-CM

## 2023-07-05 DIAGNOSIS — Z85118 Personal history of other malignant neoplasm of bronchus and lung: Secondary | ICD-10-CM

## 2023-07-05 DIAGNOSIS — F17211 Nicotine dependence, cigarettes, in remission: Secondary | ICD-10-CM | POA: Diagnosis not present

## 2023-07-05 NOTE — Progress Notes (Signed)
  Virtual Visit via Telephone Note  I connected with Gordon Navarro , 07/05/23 11:49 AM by a telemedicine application and verified that I am speaking with the correct person using two identifiers.  Location: Patient: home Provider: home   I discussed the limitations of evaluation and management by telemedicine and the availability of in person appointments. The patient expressed understanding and agreed to proceed.   Shared Decision Making Visit Lung Cancer Screening Program [phone])   Eligibility: 73 y.o. Pack Years Smoking History Calculation =36 pack years (# packs/per year x # years smoked) Recent History of coughing up blood  no Unexplained weight loss? no ( >Than 15 pounds within the last 6 months ) Prior History Lung / other cancer no (Diagnosis within the last 5 years already requiring surveillance chest CT Scans). Smoking Status Former Smoker Former Smokers: Years since quit: 13 years  Quit Date: 2012  Visit Components: Discussion included one or more decision making aids. YES Discussion included risk/benefits of screening. YES Discussion included potential follow up diagnostic testing for abnormal scans. YES Discussion included meaning and risk of over diagnosis. YES Discussion included meaning and risk of False Positives. YES Discussion included meaning of total radiation exposure. YES  Counseling Included: Importance of adherence to annual lung cancer LDCT screening. YES Impact of comorbidities on ability to participate in the program. YES Ability and willingness to under diagnostic treatment. YES  Smoking Cessation Counseling: Former Smokers:  Discussed the importance of maintaining cigarette abstinence. yes Diagnosis Code: Personal History of Nicotine Dependence. S12.108 Information about tobacco cessation classes and interventions provided to patient. Yes Patient provided with ticket for LDCT Scan. yes Written Order for Lung Cancer Screening with LDCT  placed in Epic. Yes (CT Chest Lung Cancer Screening Low Dose W/O CM) [id number]  Z12.2-Screening of respiratory organs Z87.891-Personal history of nicotine dependence   Gordon Navarro 07/05/23

## 2023-07-05 NOTE — Patient Instructions (Signed)

## 2023-07-25 ENCOUNTER — Ambulatory Visit (HOSPITAL_COMMUNITY)
Admission: RE | Admit: 2023-07-25 | Discharge: 2023-07-25 | Disposition: A | Discharge status: Home / Self Care | Payer: Medicare HMO | Source: Ambulatory Visit | Attending: Acute Care | Admitting: Acute Care

## 2023-07-25 DIAGNOSIS — Z122 Encounter for screening for malignant neoplasm of respiratory organs: Secondary | ICD-10-CM | POA: Insufficient documentation

## 2023-07-25 DIAGNOSIS — Z87891 Personal history of nicotine dependence: Secondary | ICD-10-CM | POA: Insufficient documentation

## 2023-08-04 ENCOUNTER — Telehealth: Payer: Self-pay

## 2023-08-04 DIAGNOSIS — Z87891 Personal history of nicotine dependence: Secondary | ICD-10-CM

## 2023-08-04 DIAGNOSIS — R911 Solitary pulmonary nodule: Secondary | ICD-10-CM

## 2023-08-04 NOTE — Telephone Encounter (Signed)
 Attempted to reach the patient and review results. LVM to call office. Results have been reviewed by Hosie Macadamia, NP. New nodule 5.9 mm, agrees with impression to repeat scan in 6 months. Please place CT order and fax plan with results to PCP.

## 2023-08-07 ENCOUNTER — Telehealth: Payer: Self-pay | Admitting: Acute Care

## 2023-08-07 NOTE — Telephone Encounter (Signed)
 Tiffany calling with call report. For CT scan.Tiffany phone number is [phone].

## 2023-08-07 NOTE — Telephone Encounter (Signed)
 Attempted to reach patient again to review results. LVM to call office.

## 2023-08-07 NOTE — Telephone Encounter (Signed)
 IMPRESSION: 1. Lung-RADS 3, probably benign findings. Short-term follow-up in 6 months is recommended with repeat low-dose chest CT without contrast (please use the following order, "CT CHEST LCS NODULE FOLLOW-UP W/O CM"). New indistinct subpleural 5.9 mm right upper lobe nodule. 2. One vessel coronary atherosclerosis. 3. Aortic Atherosclerosis (ICD10-I70.0) and Emphysema (ICD10-J43.9).  See other phone note from 08/04/23.

## 2023-08-08 NOTE — Addendum Note (Signed)
Addended by: Abigail Miyamoto D on: 08/08/2023 04:26 PM   Modules accepted: Orders

## 2023-08-08 NOTE — Telephone Encounter (Signed)
Spoke with pt and advised that one new small nodule was seen on CT scan. We will plan to repeat the scan in 6 months. Pt verbalized understanding. Results/ plans sent to PCP. Order placed for 6 month nodule follow up scan.

## 2023-12-18 IMAGING — DX DG CHEST 2V
2 series · 2 of 2 positions shown · non-contrast
Comparison: Chest x-ray 10/16/2019.  Chest CT 01/21/2019

CLINICAL DATA: Cough.  Shortness of breath.

EXAM:
CHEST - 2 VIEW

[chest pa]
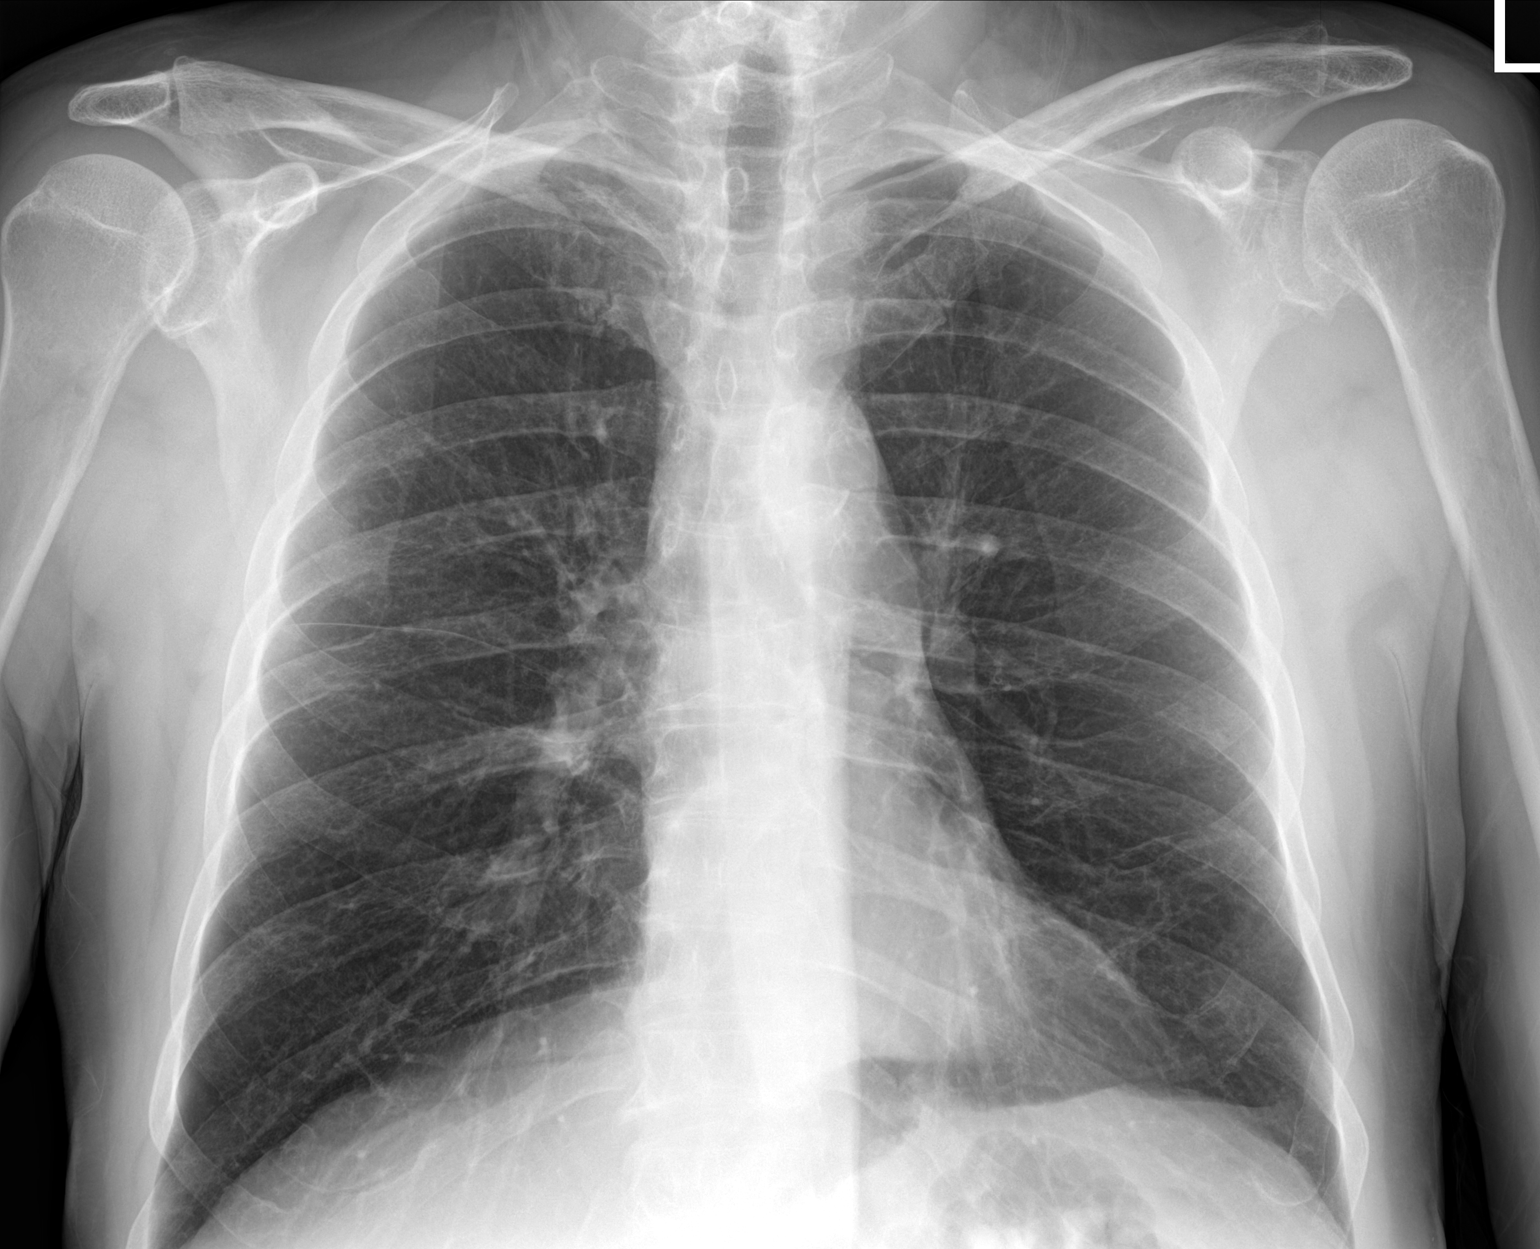

[chest lat]
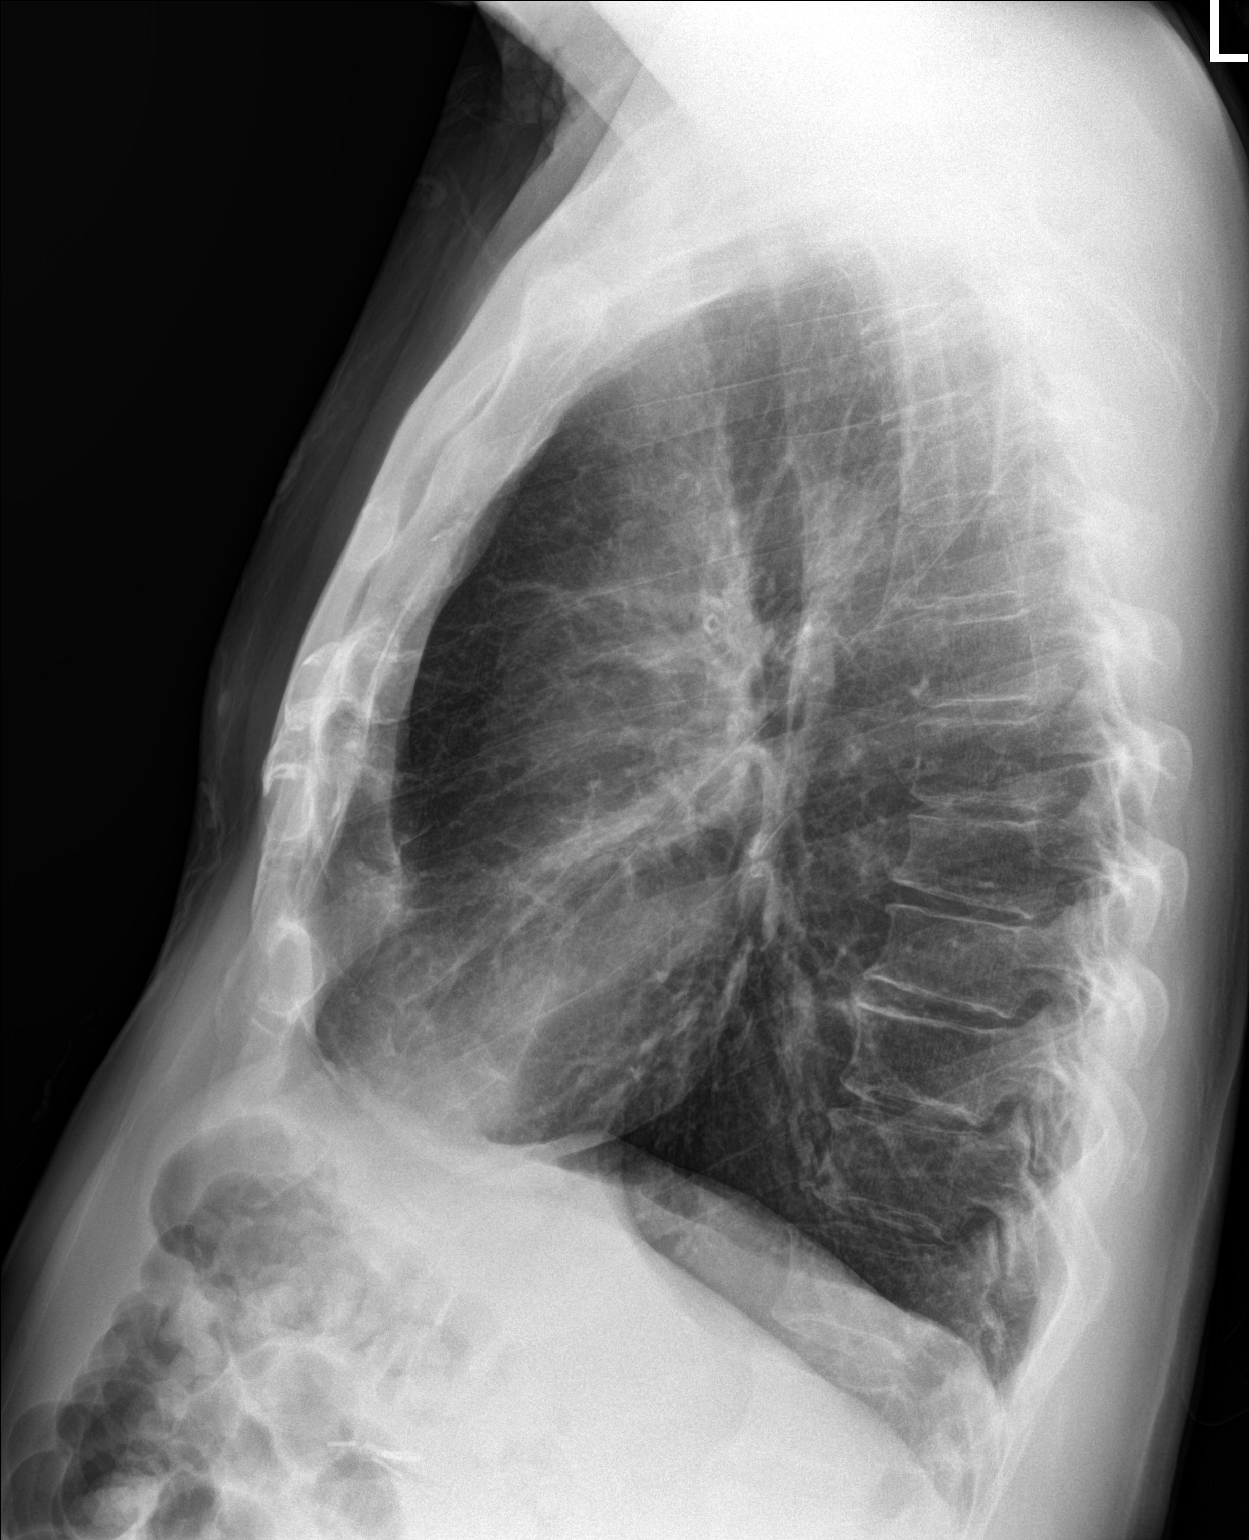

[2 of 2 positions shown; findings below may reference images not displayed]

FINDINGS: Mediastinum and hilar structures normal. Pleural thickening on the
right is again noted consistent with scarring. Similar findings
noted on prior chest CT of 01/21/2019. No acute infiltrate. No
pleural effusion or pneumothorax. Degenerative change thoracic
spine. No acute bony abnormality.
IMPRESSION: Pleural thickening on the right is again noted consistent with
scarring. No acute abnormality identified.

## 2024-01-03 ENCOUNTER — Telehealth: Payer: Self-pay

## 2024-01-03 NOTE — Telephone Encounter (Signed)
 LVM to call office and schedule 6 month f/u scan due 01/22/2024. Reminder set.

## 2024-01-17 ENCOUNTER — Other Ambulatory Visit: Payer: Self-pay | Admitting: Family Medicine

## 2024-01-23 ENCOUNTER — Ambulatory Visit (HOSPITAL_COMMUNITY): Admission: RE | Admit: 2024-01-23 | Source: Ambulatory Visit

## 2024-01-24 NOTE — Telephone Encounter (Signed)
 Rescheduled missed LDCT from 01/23/2024, now 02/08/2024

## 2024-02-01 ENCOUNTER — Encounter: Payer: Self-pay | Admitting: Family Medicine

## 2024-02-01 ENCOUNTER — Ambulatory Visit (INDEPENDENT_AMBULATORY_CARE_PROVIDER_SITE_OTHER): Admitting: Family Medicine

## 2024-02-01 VITALS — BP 109/65 | HR 84 | Temp 97.8°F | Ht 65.0 in | Wt 147.0 lb

## 2024-02-01 DIAGNOSIS — N400 Enlarged prostate without lower urinary tract symptoms: Secondary | ICD-10-CM | POA: Diagnosis not present

## 2024-02-01 DIAGNOSIS — E782 Mixed hyperlipidemia: Secondary | ICD-10-CM

## 2024-02-01 DIAGNOSIS — E559 Vitamin D deficiency, unspecified: Secondary | ICD-10-CM

## 2024-02-01 DIAGNOSIS — J432 Centrilobular emphysema: Secondary | ICD-10-CM

## 2024-02-01 MED ORDER — ROSUVASTATIN CALCIUM 10 MG PO TABS: 10.0000 mg | ORAL_TABLET | Freq: Every day | ORAL | 1 refills | Status: AC

## 2024-02-01 MED ORDER — BREO ELLIPTA 100-25 MCG/ACT IN AEPB: 1.0000 | INHALATION_SPRAY | Freq: Every day | RESPIRATORY_TRACT | 3 refills | Status: DC

## 2024-02-01 NOTE — Progress Notes (Signed)
 Subjective:  Patient ID: Gordon Navarro, male    DOB: [birth year]  Age: 73 y.o. MRN: [id number]  CC: Medical Management of Chronic Issues   HPI Gordon Navarro presents for follow-up on his emphysema.  He is doing well with current treatment has no complaints continues to use inhalers Breo Ellipta  as ordered.  Patient in for follow-up of elevated cholesterol. Doing well without complaints on current medication. Denies side effects of statin including myalgia and arthralgia and nausea. Also in today for liver function testing. Currently no chest pain, shortness of breath or other cardiovascular related symptoms noted.  Did not make it to 10 and outside     02/22/2023    3:04 PM 12/08/2022    1:10 PM 02/03/2022   12:05 PM  Depression screen PHQ 2/9  Decreased Interest 0 0 0  Down, Depressed, Hopeless 0 0 0  PHQ - 2 Score 0 0 0    History Gordon Navarro has a past medical history of Emphysema of lung (HCC).   He has a past surgical history that includes Skin graft (1985); Cholecystectomy (N/A, 10/18/2019); Colonoscopy with propofol  (N/A, 04/27/2023); and polypectomy (04/27/2023).   His family history includes Heart attack in his father.He reports that he quit smoking about 10 years ago. His smoking use included cigarettes. He started smoking about 50 years ago. He has a 40 pack-year smoking history. He has never used smokeless tobacco. He reports current alcohol use. He reports that he does not use drugs.    ROS Review of Systems  Constitutional:  Negative for fever.  Respiratory:  Negative for shortness of breath.   Cardiovascular:  Negative for chest pain.  Musculoskeletal:  Negative for arthralgias.  Skin:  Negative for rash.    Objective:  BP 109/65   Pulse 84   Temp 97.8 F (36.6 C)   Ht 5' 5 (1.651 m)   Wt 147 lb (66.7 kg)   SpO2 92%   BMI 24.46 kg/m   BP Readings from Last 3 Encounters:  02/01/24 109/65  04/27/23 (!) 91/46  12/08/22 (!) 91/53    Wt Readings from Last 3  Encounters:  02/01/24 147 lb (66.7 kg)  04/27/23 160 lb (72.6 kg)  02/22/23 155 lb (70.3 kg)     Physical Exam Constitutional:      General: He is not in acute distress.    Appearance: He is well-developed.  HENT:     Head: Normocephalic and atraumatic.     Right Ear: External ear normal.     Left Ear: External ear normal.     Nose: Nose normal.  Eyes:     Conjunctiva/sclera: Conjunctivae normal.     Pupils: Pupils are equal, round, and reactive to light.  Cardiovascular:     Rate and Rhythm: Normal rate and regular rhythm.     Heart sounds: Normal heart sounds. No murmur heard. Pulmonary:     Effort: Pulmonary effort is normal. No respiratory distress.     Breath sounds: Normal breath sounds. No wheezing or rales.  Abdominal:     Palpations: Abdomen is soft.     Tenderness: There is no abdominal tenderness.  Musculoskeletal:        General: Normal range of motion.     Cervical back: Normal range of motion and neck supple.  Skin:    General: Skin is warm and dry.  Neurological:     Mental Status: He is alert and oriented to person, place, and time.     Deep  Tendon Reflexes: Reflexes are normal and symmetric.  Psychiatric:        Behavior: Behavior normal.        Thought Content: Thought content normal.        Judgment: Judgment normal.      Assessment & Plan:  Centrilobular emphysema (HCC) -     Breo Ellipta ; Inhale 1 puff into the lungs daily.  Dispense: 120 each; Refill: 3 -     CBC with Differential/Platelet -     CMP14+EGFR  Vitamin D  deficiency -     CBC with Differential/Platelet -     CMP14+EGFR  Benign prostatic hyperplasia without lower urinary tract symptoms -     CBC with Differential/Platelet -     CMP14+EGFR -     PSA, total and free  Mixed hyperlipidemia -     Rosuvastatin  Calcium ; Take 1 tablet (10 mg total) by mouth daily. For cholesterol  Dispense: 90 tablet; Refill: 1 -     Lipid panel     Follow-up: No follow-ups on file.  Butler Der, M.D.

## 2024-02-04 ENCOUNTER — Encounter: Payer: Self-pay | Admitting: Family Medicine

## 2024-02-09 ENCOUNTER — Ambulatory Visit (HOSPITAL_COMMUNITY)

## 2024-03-11 ENCOUNTER — Ambulatory Visit (HOSPITAL_COMMUNITY)
Admission: RE | Admit: 2024-03-11 | Discharge: 2024-03-11 | Disposition: A | Discharge status: Home / Self Care | Source: Ambulatory Visit | Attending: Acute Care | Admitting: Acute Care

## 2024-03-11 DIAGNOSIS — R911 Solitary pulmonary nodule: Secondary | ICD-10-CM | POA: Insufficient documentation

## 2024-03-11 DIAGNOSIS — Z87891 Personal history of nicotine dependence: Secondary | ICD-10-CM | POA: Diagnosis not present

## 2024-03-11 DIAGNOSIS — J439 Emphysema, unspecified: Secondary | ICD-10-CM | POA: Diagnosis not present

## 2024-03-25 ENCOUNTER — Telehealth: Payer: Self-pay

## 2024-03-25 ENCOUNTER — Other Ambulatory Visit: Payer: Self-pay

## 2024-03-25 DIAGNOSIS — R911 Solitary pulmonary nodule: Secondary | ICD-10-CM

## 2024-03-25 DIAGNOSIS — Z122 Encounter for screening for malignant neoplasm of respiratory organs: Secondary | ICD-10-CM

## 2024-03-25 DIAGNOSIS — Z87891 Personal history of nicotine dependence: Secondary | ICD-10-CM

## 2024-03-25 NOTE — Telephone Encounter (Signed)
 Results reviewed by Ruthell, NP. Please call patient and advise follow up CT will be needed in 6 months  for nodule growth. Place order, results and plan to PCP.

## 2024-03-25 NOTE — Telephone Encounter (Signed)
 Called and spoke with the patient. Reviewed recent lung CT results. He is in agreement to a 6 month follow up scan due 09/09/2024. He requested a call back closer to the due date to make appt. Order placed and reminder set. Results and plan to PCP.

## 2024-05-01 ENCOUNTER — Ambulatory Visit: Payer: Self-pay

## 2024-05-01 VITALS — BP 109/65 | HR 84 | Ht 65.0 in | Wt 147.0 lb

## 2024-05-01 DIAGNOSIS — Z0000 Encounter for general adult medical examination without abnormal findings: Secondary | ICD-10-CM | POA: Diagnosis not present

## 2024-05-01 NOTE — Progress Notes (Signed)
 Subjective:   Gordon Navarro is a 73 y.o. male who presents for a Medicare Annual Wellness Visit.  I connected with  Cairo Para on 05/01/24 by a audio enabled telemedicine application and verified that I am speaking with the correct person using two identifiers.  Patient Location: Home  Provider Location: Home Office  I discussed the limitations of evaluation and management by telemedicine. The patient expressed understanding and agreed to proceed.   Allergies (verified) Patient has no known allergies.   History: Past Medical History:  Diagnosis Date   Emphysema of lung (HCC)    Past Surgical History:  Procedure Laterality Date   CHOLECYSTECTOMY N/A 10/18/2019   Procedure: LAPAROSCOPIC CHOLECYSTECTOMY;  Surgeon: Mavis Anes, MD;  Location: [facility];  Service: General;  Laterality: N/A;   COLONOSCOPY WITH PROPOFOL  N/A 04/27/2023   Procedure: COLONOSCOPY WITH PROPOFOL ;  Surgeon: Shaaron Lamar HERO, MD;  Location: [facility];  Service: Endoscopy;  Laterality: N/A;  215pm, asa 2   POLYPECTOMY  04/27/2023   Procedure: POLYPECTOMY;  Surgeon: Shaaron Lamar HERO, MD;  Location: [facility];  Service: Endoscopy;;   SKIN GRAFT  1985   Left hand   Family History  Problem Relation Age of Onset   Heart attack Father    Colon cancer Neg Hx    Social History   Occupational History   Occupation: retired  Tobacco Use   Smoking status: Former    Current packs/day: 0.00    Average packs/day: 1 pack/day for 40.0 years (40.0 ttl pk-yrs)    Types: Cigarettes    Start date: 06/27/1973    Quit date: 06/27/2013    Years since quitting: 10.8   Smokeless tobacco: Never  Substance and Sexual Activity   Alcohol use: Yes    Comment: 1 ounce of liquor some days   Drug use: Never   Sexual activity: Not Currently   Tobacco Counseling Counseling given: Yes  SDOH Screenings   Food Insecurity: No Food Insecurity (05/01/2024)  Housing: Unknown (05/01/2024)  Transportation Needs: No  Transportation Needs (05/01/2024)  Utilities: Not At Risk (05/01/2024)  Alcohol Screen: Low Risk  (02/22/2023)  Depression (PHQ2-9): Low Risk  (05/01/2024)  Financial Resource Strain: Low Risk  (02/22/2023)  Physical Activity: Insufficiently Active (05/01/2024)  Social Connections: Socially Isolated (05/01/2024)  Stress: No Stress Concern Present (05/01/2024)  Tobacco Use: Medium Risk (05/01/2024)  Health Literacy: Adequate Health Literacy (05/01/2024)   Depression Screen    05/01/2024    3:50 PM 02/22/2023    3:04 PM 12/08/2022    1:10 PM 02/03/2022   12:05 PM 12/06/2021    1:13 PM 06/07/2021    1:40 PM 12/10/2020    4:16 PM  PHQ 2/9 Scores  PHQ - 2 Score 0 0 0 0 0 0 0  PHQ- 9 Score       2     Goals Addressed             This Visit's Progress    Exercise 3x per week (30 min per time)   On track      Visit info / Clinical Intake: Medicare Wellness Visit Type:: Subsequent Annual Wellness Visit Medicare Wellness Visit Mode:: Telephone If telephone:: video declined If telephone or video:: vitals recorded from last visit Interpreter Needed?: No Pre-visit prep was completed: yes AWV questionnaire completed by patient prior to visit?: no Living arrangements:: (!) lives alone Patient's Overall Health Status Rating: very good Typical amount of pain: none Does pain affect daily life?: no  Are you currently prescribed opioids?: no  Dietary Habits and Nutritional Risks How many meals a day?: (!) 0 (pt eat all day) Eats fruit and vegetables daily?: yes Most meals are obtained by: preparing own meals Diabetic:: no  Functional Status Activities of Daily Living (to include ambulation/medication): Independent Ambulation: Independent Medication Administration: Independent Home Management: Independent Manage your own finances?: yes Primary transportation is: driving Concerns about hearing?: no  Fall Screening Falls in the past year?: 0 Number of falls in past year: 0 Was there an  injury with Fall?: 0 Fall Risk Category Calculator: 0 Patient Fall Risk Level: Low Fall Risk  Fall Risk Patient at Risk for Falls Due to: No Fall Risks Fall risk Follow up: Falls evaluation completed; Education provided  Home and Transportation Safety: All rugs have non-skid backing?: yes All stairs or steps have railings?: N/A, no stairs Grab bars in the bathtub or shower?: yes Have non-skid surface in bathtub or shower?: (!) no Good home lighting?: yes Regular seat belt use?: yes  Cognitive Assessment Will 6CIT or Mini Cog be Completed: yes What year is it?: 0 points What month is it?: 0 points Give patient an address phrase to remember (5 components): [address] [facility] About what time is it?: 0 points Count backwards from 20 to 1: 0 points Say the months of the year in reverse: 0 points Repeat the address phrase from earlier: 0 points 6 CIT Score: 0 points  Advance Directives (For Healthcare) Does Patient Have a Medical Advance Directive?: No Would patient like information on creating a medical advance directive?: -- (pt told to pick up info at the office at next ov)  Reviewed/Updated  Reviewed/Updated: All; Medical History; Surgical History; Family History; Medications; Allergies; Care Teams; Patient Goals        Objective:    Today's Vitals   05/01/24 1542  BP: 109/65  Pulse: 84  Weight: 147 lb (66.7 kg)  Height: 5' 5 (1.651 m)   Body mass index is 24.46 kg/m.  Current Medications (verified) Outpatient Encounter Medications as of 05/01/2024  Medication Sig   BREO ELLIPTA  100-25 MCG/ACT AEPB Inhale 1 puff into the lungs daily.   rosuvastatin  (CRESTOR ) 10 MG tablet Take 1 tablet (10 mg total) by mouth daily. For cholesterol   No facility-administered encounter medications on file as of 05/01/2024.   Hearing/Vision screen Hearing Screening - Comments:: Pt denies hearing dif Vision Screening - Comments:: Pt denies vision dif Immunizations and Health  Maintenance Health Maintenance  Topic Date Due   Hepatitis C Screening  Never done   DTaP/Tdap/Td (1 - Tdap) Never done   COVID-19 Vaccine (3 - 2025-26 season) 02/26/2024   Influenza Vaccine  09/24/2024 (Originally 01/26/2024)   Lung Cancer Screening  03/11/2025   Medicare Annual Wellness (AWV)  05/01/2025   Colonoscopy  04/26/2033   Pneumococcal Vaccine: 50+ Years  Completed   Zoster Vaccines- Shingrix  Completed   Meningococcal B Vaccine  Aged Out        Assessment/Plan:  This is a routine wellness examination for Roshon.  Patient Care Team: Zollie Lowers, MD as PCP - General (Family Medicine) Harvey Margo CROME, MD (Inactive) as Consulting Physician (Gastroenterology)  I have personally reviewed and noted the following in the patient's chart:   Medical and social history Use of alcohol, tobacco or illicit drugs  Current medications and supplements including opioid prescriptions. Functional ability and status Nutritional status Physical activity Advanced directives List of other physicians Hospitalizations, surgeries, and ER  visits in previous 12 months Vitals Screenings to include cognitive, depression, and falls Referrals and appointments  No orders of the defined types were placed in this encounter.  In addition, I have reviewed and discussed with patient certain preventive protocols, quality metrics, and best practice recommendations. A written personalized care plan for preventive services as well as general preventive health recommendations were provided to patient.   Ozie Ned, CMA   05/01/2024   Return in 1 year (on 05/01/2025).  After Visit Summary: (MyChart) Due to this being a telephonic visit, the after visit summary with patients personalized plan was offered to patient via MyChart   Nurse Notes: pt aware and due the following: covd, DTAP, Hep C screening.

## 2024-05-21 ENCOUNTER — Other Ambulatory Visit: Payer: Self-pay | Admitting: Family Medicine

## 2024-05-21 DIAGNOSIS — J432 Centrilobular emphysema: Secondary | ICD-10-CM

## 2024-07-12 NOTE — Progress Notes (Signed)
 Gordon Navarro                                          MRN: [id number]   07/12/2024   The [facility] Quality Team Specialist reviewed this patient medical record for the purposes of chart review for care gap closure. The following were reviewed: abstraction for care gap closure-care for older adult medication review.    [facility] Quality Team

## 2024-08-05 ENCOUNTER — Ambulatory Visit: Payer: Self-pay | Admitting: Family Medicine

## 2025-05-05 ENCOUNTER — Ambulatory Visit
